# Patient Record
Sex: Female | Born: 1975 | Race: White | Hispanic: No | Marital: Married | Smoking: Never smoker
Health system: Southern US, Community
[De-identification: ages and names within clinical notes are randomized; demographics above are authoritative.]

## PROBLEM LIST (undated history)

## (undated) DIAGNOSIS — F32A Depression, unspecified: Secondary | ICD-10-CM

## (undated) DIAGNOSIS — T7840XA Allergy, unspecified, initial encounter: Secondary | ICD-10-CM

## (undated) DIAGNOSIS — R011 Cardiac murmur, unspecified: Secondary | ICD-10-CM

## (undated) DIAGNOSIS — I10 Essential (primary) hypertension: Secondary | ICD-10-CM

## (undated) DIAGNOSIS — R51 Headache: Secondary | ICD-10-CM

## (undated) DIAGNOSIS — R519 Headache, unspecified: Secondary | ICD-10-CM

## (undated) HISTORY — DX: Cardiac murmur, unspecified: R01.1

## (undated) HISTORY — DX: Depression, unspecified: F32.A

## (undated) HISTORY — DX: Headache, unspecified: R51.9

## (undated) HISTORY — DX: Allergy, unspecified, initial encounter: T78.40XA

## (undated) HISTORY — DX: Headache: R51

## (undated) HISTORY — PX: WISDOM TOOTH EXTRACTION: SHX21

## (undated) HISTORY — DX: Essential (primary) hypertension: I10

## (undated) HISTORY — PX: FRACTURE SURGERY: SHX138

---
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
## 1999-03-22 ENCOUNTER — Other Ambulatory Visit: Admission: RE | Admit: 1999-03-22 | Discharge: 1999-03-22 | Payer: Self-pay | Admitting: Obstetrics and Gynecology

## 1999-05-27 ENCOUNTER — Emergency Department (HOSPITAL_COMMUNITY): Admission: EM | Admit: 1999-05-27 | Discharge: 1999-05-27 | Payer: Self-pay | Admitting: Emergency Medicine

## 2000-04-01 ENCOUNTER — Other Ambulatory Visit: Admission: RE | Admit: 2000-04-01 | Discharge: 2000-04-01 | Payer: Self-pay | Admitting: Obstetrics and Gynecology

## 2001-04-20 ENCOUNTER — Other Ambulatory Visit: Admission: RE | Admit: 2001-04-20 | Discharge: 2001-04-20 | Payer: Self-pay | Admitting: Obstetrics and Gynecology

## 2001-07-29 DIAGNOSIS — I10 Essential (primary) hypertension: Secondary | ICD-10-CM

## 2001-07-29 HISTORY — DX: Essential (primary) hypertension: I10

## 2001-12-15 ENCOUNTER — Ambulatory Visit: Admission: RE | Admit: 2001-12-15 | Discharge: 2001-12-15 | Payer: Self-pay | Admitting: Obstetrics and Gynecology

## 2002-03-29 ENCOUNTER — Inpatient Hospital Stay (HOSPITAL_COMMUNITY): Admission: AD | Admit: 2002-03-29 | Discharge: 2002-04-01 | Payer: Self-pay | Admitting: Obstetrics and Gynecology

## 2002-05-10 ENCOUNTER — Other Ambulatory Visit: Admission: RE | Admit: 2002-05-10 | Discharge: 2002-05-10 | Payer: Self-pay | Admitting: Obstetrics and Gynecology

## 2003-05-02 ENCOUNTER — Other Ambulatory Visit: Admission: RE | Admit: 2003-05-02 | Discharge: 2003-05-02 | Payer: Self-pay | Admitting: Obstetrics and Gynecology

## 2004-04-18 ENCOUNTER — Other Ambulatory Visit: Admission: RE | Admit: 2004-04-18 | Discharge: 2004-04-18 | Payer: Self-pay | Admitting: Obstetrics and Gynecology

## 2004-10-19 ENCOUNTER — Inpatient Hospital Stay (HOSPITAL_COMMUNITY): Admission: AD | Admit: 2004-10-19 | Discharge: 2004-10-22 | Payer: Self-pay | Admitting: Obstetrics and Gynecology

## 2004-11-29 ENCOUNTER — Other Ambulatory Visit: Admission: RE | Admit: 2004-11-29 | Discharge: 2004-11-29 | Payer: Self-pay | Admitting: Obstetrics and Gynecology

## 2012-07-07 ENCOUNTER — Other Ambulatory Visit: Payer: Self-pay | Admitting: Obstetrics and Gynecology

## 2012-07-07 DIAGNOSIS — R928 Other abnormal and inconclusive findings on diagnostic imaging of breast: Secondary | ICD-10-CM

## 2012-07-15 ENCOUNTER — Ambulatory Visit
Admission: RE | Admit: 2012-07-15 | Discharge: 2012-07-15 | Disposition: A | Discharge status: Home / Self Care | Payer: PRIVATE HEALTH INSURANCE | Source: Ambulatory Visit | Attending: Obstetrics and Gynecology | Admitting: Obstetrics and Gynecology

## 2012-07-15 DIAGNOSIS — R928 Other abnormal and inconclusive findings on diagnostic imaging of breast: Secondary | ICD-10-CM

## 2013-05-11 LAB — HEPATIC FUNCTION PANEL
ALT: 12 (ref 7–35)
AST: 15 (ref 13–35)
Alkaline Phosphatase: 35 (ref 25–125)
BILIRUBIN, TOTAL: 0.6

## 2013-05-11 LAB — CBC AND DIFFERENTIAL
HEMATOCRIT: 37 (ref 36–46)
HEMOGLOBIN: 12.4 (ref 12.0–16.0)
WBC: 6.8

## 2013-05-11 LAB — BASIC METABOLIC PANEL
GLUCOSE: 84
POTASSIUM: 4.2 (ref 3.4–5.3)
Sodium: 136 — AB (ref 137–147)

## 2013-05-11 LAB — LIPID PANEL
CHOLESTEROL: 167 (ref 0–200)
HDL: 58 (ref 35–70)
LDL Cholesterol: 90
Triglycerides: 95 (ref 40–160)

## 2014-10-04 LAB — CBC AND DIFFERENTIAL
HCT: 38 (ref 36–46)
Hemoglobin: 12.1 (ref 12.0–16.0)
PLATELETS: 272 (ref 150–399)
WBC: 6.2

## 2014-10-04 LAB — BASIC METABOLIC PANEL
BUN: 7 (ref 4–21)
CREATININE: 0.7 (ref 0.5–1.1)
Glucose: 78

## 2014-10-04 LAB — LIPID PANEL
Cholesterol: 167 (ref 0–200)
HDL: 87 — AB (ref 35–70)
LDL Cholesterol: 95
Triglycerides: 77 (ref 40–160)

## 2014-10-04 LAB — HEPATIC FUNCTION PANEL
ALT: 13 (ref 7–35)
AST: 14 (ref 13–35)
Alkaline Phosphatase: 39 (ref 25–125)
BILIRUBIN, TOTAL: 0.3

## 2014-10-07 LAB — BASIC METABOLIC PANEL
POTASSIUM: 3.7 (ref 3.4–5.3)
SODIUM: 138 (ref 137–147)

## 2016-12-30 ENCOUNTER — Ambulatory Visit: Payer: PRIVATE HEALTH INSURANCE | Admitting: Family Medicine

## 2017-02-03 ENCOUNTER — Ambulatory Visit (INDEPENDENT_AMBULATORY_CARE_PROVIDER_SITE_OTHER): Payer: PRIVATE HEALTH INSURANCE | Admitting: Family Medicine

## 2017-02-03 ENCOUNTER — Encounter: Payer: Self-pay | Admitting: Family Medicine

## 2017-02-03 VITALS — BP 136/89 | HR 52 | Ht 65.5 in | Wt 205.6 lb

## 2017-02-03 DIAGNOSIS — I341 Nonrheumatic mitral (valve) prolapse: Secondary | ICD-10-CM | POA: Diagnosis not present

## 2017-02-03 DIAGNOSIS — Z818 Family history of other mental and behavioral disorders: Secondary | ICD-10-CM | POA: Diagnosis not present

## 2017-02-03 DIAGNOSIS — E66811 Obesity, class 1: Secondary | ICD-10-CM

## 2017-02-03 DIAGNOSIS — Z723 Lack of physical exercise: Secondary | ICD-10-CM | POA: Insufficient documentation

## 2017-02-03 DIAGNOSIS — R030 Elevated blood-pressure reading, without diagnosis of hypertension: Secondary | ICD-10-CM | POA: Diagnosis not present

## 2017-02-03 DIAGNOSIS — E663 Overweight: Secondary | ICD-10-CM | POA: Insufficient documentation

## 2017-02-03 DIAGNOSIS — R002 Palpitations: Secondary | ICD-10-CM

## 2017-02-03 DIAGNOSIS — Z8659 Personal history of other mental and behavioral disorders: Secondary | ICD-10-CM | POA: Diagnosis not present

## 2017-02-03 DIAGNOSIS — I10 Essential (primary) hypertension: Secondary | ICD-10-CM | POA: Insufficient documentation

## 2017-02-03 DIAGNOSIS — Z9049 Acquired absence of other specified parts of digestive tract: Secondary | ICD-10-CM | POA: Insufficient documentation

## 2017-02-03 DIAGNOSIS — E669 Obesity, unspecified: Secondary | ICD-10-CM | POA: Diagnosis not present

## 2017-02-03 NOTE — Progress Notes (Signed)
New patient office visit note:  Impression and Recommendations:    1. Elevated blood pressure reading   2. Obesity, Class I, BMI 30-34.9   3. History of depression   4. Family history of depression   5. Heart palpitations- random times, lasts secs   6. History of diagnosis of MVP (mitral valve prolapse)   7. Inactivity    - Patient will check her blood pressure 3-4 times weekly.  She will write it down and bring in next office visit.  Follow up sooner than planned if not at goal of 130\80 on a regular basis.  Weight loss and low-salt diet discussed with patient.     - For weight, asked patient to please follow-up in the near future if she would like to discuss strategies for weight loss.  She was not sure if she wanted to or not today.     - Mood is stable.  Explained that exercising would improve her mental state.  Denies need for meds.  Understands her increased risk due to family history and personal history  - Red flag symptoms discussed with patient.  Her heart palpitations symptoms are not bothersome enough or frequent enough that she she feels she needs further evaluation at this time.  She will let us know if they worsen  - Goal is to walk 5 minutes every other day for 2 weeks, then 10 minutes every other day for 2 weeks then slowly over time, work your way up to 30-40 minutes daily.  The patient was counseled, risk factors were discussed, anticipatory guidance given.   Return for Return to clinic in near future for physical-come fasting for blood work.  Please see AVS handed out to patient at the end of our visit for further patient instructions/ counseling done pertaining to today's office visit.    Note: This document was prepared using Dragon voice recognition software and may include unintentional dictation errors.  ----------------------------------------------------------------------------------------------------------------------    Subjective:    Chief  complaint:   Chief Complaint  Patient presents with  . Establish Care     HPI: Robin Barajas is a Very pleasant 41 y.o. female who presents to [facility] today to review their medical history with me and establish care.   I asked the patient to review their chronic problem list with me to ensure everything was updated and accurate.  All recent office visits with other providers, any medical records that patient brought in etc  - I reviewed today.     Also asked pt to get me medical records from [facility] providers/ specialists that they had seen within the past 3-5 years- if they are in private practice and/or do not work for a [employer], [facility], [place], [facility] or [employer] owned practice.  Told them to call their specialists to clarify this if they are not sure.   Prior doc--> Dr. Renato Gails- at [facility]- which I explained to patient importance of her getting me those records.   BP: Was on one med after first child- couple mo only-  in 2003.  She was on blood pressure medicines for a couple months following the birth of her child.  Never was on meds during pregnancy.  Her PCP has been monitoring her blood pressure as well as her GYN over the past couple of years and has never told her that it was bad enough for medications.    Patient has concerns because she occ gets  visual changes (sees OD) and gets HA occ she thinks with elevated BP.    She hasn't not really been monitoring BP much at home- only occ does- 1/month if that;  but this is at least 30 minutes or more after she gets those symptoms and Bp is essentially what it is today.    OB- GYN doc, physician for women's.  Dr. Rana Snare.     Optometrist- Clista Bernhardt, OD in [place] Pt. seeing him because she occasionally sees squiggly lines in her vision.  These are usually in the peripheral fields.  She denies seeing any damage of her arteries from her elevated blood pressure.     In HS- had CP with activity--> dx with MVP--> seen by Cards- Dr  Corinda Gubler yrs ago.  Was on meds short period of time- couple yrs.     Patient tells me today she occasionally gets palpitations at random times now, not related to activity.  Maybe stress levels she can tell.  She does not have any associated symptoms with these episodes and only lasts for a couple seconds then resolves spontaneously.   Obese- been problem ever since children born and diff to take off.  She has tried many diets and none stuck- yo-yo frequently and pt got discouraged and feels apathetic about it now.      Problem  Elevated blood pressure reading- borderline many yrs   Patient was told after her first pregnancy of her daughter back in 2003 that she had some elevated blood pressure.  For couple months afterward she was on some blood pressure medicines but has not been on anything since.   Obesity, Class I, Bmi 30-34.9  History of diagnosis of MVP (mitral valve prolapse)  Heart palpitations- random times, lasts secs  Family History of Depression  personal history of depression  History of Appendectomy  Inactivity      Wt Readings from Last 3 Encounters:  02/03/17 205 lb 9.6 oz (93.3 kg)   BP Readings from Last 3 Encounters:  02/03/17 136/89   Pulse Readings from Last 3 Encounters:  02/03/17 (!) 52   BMI Readings from Last 3 Encounters:  02/03/17 33.69 kg/m    Patient Care Team    Relationship Specialty Notifications Start End  Thomasene Lot, DO PCP - General Family Medicine  02/03/17   Candice Camp, MD Consulting Physician Obstetrics and Gynecology  02/03/17     Patient Active Problem List   Diagnosis Date Noted  . Elevated blood pressure reading- borderline many yrs 02/03/2017    Priority: High  . Obesity, Class I, BMI 30-34.9 02/03/2017    Priority: High  . History of diagnosis of MVP (mitral valve prolapse) 02/03/2017    Priority: Medium  . Heart palpitations- random times, lasts secs 02/03/2017    Priority: Medium  . Family history of depression  02/03/2017    Priority: Low  . personal history of depression 02/03/2017    Priority: Low  . History of appendectomy 02/03/2017  . Inactivity 02/03/2017     Past Medical History:  Diagnosis Date  . Generalized headaches   . Hypertension 2003     Past Medical History:  Diagnosis Date  . Generalized headaches   . Hypertension 2003     Past Surgical History:  Procedure Laterality Date  . APPENDECTOMY  1990  . ELBOW FRACTURE SURGERY  1984     Family History  Problem Relation Age of Onset  . Depression Mother   . Depression Sister   .  Alcohol abuse Maternal Grandfather   . Cancer Paternal Grandmother        ovarian  . Diabetes Paternal Grandfather      History  Drug Use No     History  Alcohol Use No     History  Smoking Status  . Never Smoker  Smokeless Tobacco  . Never Used     No outpatient encounter prescriptions on file as of 02/03/2017.   No facility-administered encounter medications on file as of 02/03/2017.     Allergies: Morphine and related and Codeine   Review of Systems  Constitutional: Negative for chills, diaphoresis, fever, malaise/fatigue and weight loss.  HENT: Negative for congestion, sore throat and tinnitus.   Eyes: Negative for blurred vision, double vision and photophobia.  Respiratory: Negative for cough and wheezing.   Cardiovascular: Positive for palpitations. Negative for chest pain.       History of heart palpitations.  Had Cp and mitral valve prolapse dx as child.  Gastrointestinal: Negative for blood in stool, diarrhea, nausea and vomiting.  Genitourinary: Negative for dysuria, frequency and urgency.  Musculoskeletal: Negative for joint pain and myalgias.  Skin: Negative for itching and rash.  Neurological: Positive for headaches. Negative for dizziness, focal weakness and weakness.       Chronic, occasionally  Endo/Heme/Allergies: Negative for environmental allergies and polydipsia. Does not bruise/bleed easily.    Psychiatric/Behavioral: Negative for depression and memory loss. The patient is not nervous/anxious and does not have insomnia.      Objective:   Blood pressure 136/89, pulse (!) 52, height 5' 5.5" (1.664 m), weight 205 lb 9.6 oz (93.3 kg), last menstrual period 01/11/2017. Body mass index is 33.69 kg/m. General: Well Developed, well nourished, and in no acute distress.  Neuro: Alert and oriented x3, extra-ocular muscles intact, sensation grossly intact.  HEENT:[place]/AT, PERRLA, neck supple, No carotid bruits Skin: no gross rashes  Cardiac: Regular rate and rhythm Respiratory: Essentially clear to auscultation bilaterally. Not using accessory muscles, speaking in full sentences.  Abdominal: not grossly distended Musculoskeletal: Ambulates w/o diff, FROM * 4 ext.  Vasc: less 2 sec cap RF, warm and pink  Psych:  No HI/SI, judgement and insight good, Euthymic mood. Full Affect.    No results found for this or any previous visit (from the past 2160 hour(s)).

## 2017-02-03 NOTE — Patient Instructions (Addendum)
Goal is to walk 5 minutes every other day for 2 weeks, then 10 minutes every other day for 2 weeks then slowly over time, work your way up to 30-40 minutes daily.    Please check your blood pressure a couple of times a week approximately 3-4.  Please do it after sitting and resting for 15-20 minutes and write it down.  Bring this in next office visit.  Follow up sooner than planned if you remain above the goal of 130\80 on a regular basis     Please realize, EXERCISE IS MEDICINE!  -  American Heart Association [facility]) guidelines for exercise : If you are in good health, without any medical conditions, you should engage in 150 minutes of moderate intensity aerobic activity per week.  This means you should be huffing and puffing throughout your workout.   Engaging in regular exercise will improve brain function and memory, as well as improve mood, boost immune system and help with weight management.  As well as the other, more well-known effects of exercise such as decreasing blood sugar levels, decreasing blood pressure,  and decreasing bad cholesterol levels/ increasing good cholesterol levels.     -  The AHA strongly endorses consumption of a diet that contains a variety of foods from all the food categories with an emphasis on fruits and vegetables; fat-free and low-fat dairy products; cereal and grain products; legumes and nuts; and fish, poultry, and/or extra lean meats.    Excessive food intake, especially of foods high in saturated and trans fats, sugar, and salt, should be avoided.    Adequate water intake of roughly 1/2 of your weight in pounds, should equal the ounces of water per day you should drink.  So for instance, if you're 200 pounds, that would be 100 ounces of water per day.        Mediterranean Diet  Why follow it? Research shows. . Those who follow the Mediterranean diet have a reduced risk of heart disease  . The diet is associated with a reduced incidence of Parkinson's and  Alzheimer's diseases . People following the diet may have longer life expectancies and lower rates of chronic diseases  . The Dietary Guidelines for Americans recommends the Mediterranean diet as an eating plan to promote health and prevent disease What Is the Mediterranean Diet?  . Healthy eating plan based on typical foods and recipes of Mediterranean-style cooking . The diet is primarily a plant based diet; these foods should make up a majority of meals   Starches - Plant based foods should make up a majority of meals - They are an important sources of vitamins, minerals, energy, antioxidants, and fiber - Choose whole grains, foods high in fiber and minimally processed items  - Typical grain sources include wheat, oats, barley, corn, brown rice, bulgar, farro, millet, polenta, couscous  - Various types of beans include chickpeas, lentils, fava beans, black beans, white beans   Fruits  Veggies - Large quantities of antioxidant rich fruits & veggies; 6 or more servings  - Vegetables can be eaten raw or lightly drizzled with oil and cooked  - Vegetables common to the traditional Mediterranean Diet include: artichokes, arugula, beets, broccoli, brussel sprouts, cabbage, carrots, celery, collard greens, cucumbers, eggplant, kale, leeks, lemons, lettuce, mushrooms, okra, onions, peas, peppers, potatoes, pumpkin, radishes, rutabaga, shallots, spinach, sweet potatoes, turnips, zucchini - Fruits common to the Mediterranean Diet include: apples, apricots, avocados, cherries, clementines, dates, figs, grapefruits, grapes, melons, nectarines, oranges, peaches, pears,  pomegranates, strawberries, tangerines  Fats - Replace butter and margarine with healthy oils, such as olive oil, canola oil, and tahini  - Limit nuts to no more than a handful a day  - Nuts include walnuts, almonds, pecans, pistachios, pine nuts  - Limit or avoid candied, honey roasted or heavily salted nuts - Olives are central to the  Mediterranean diet - can be eaten whole or used in a variety of dishes   Meats Protein - Limiting red meat: no more than a few times a month - When eating red meat: choose lean cuts and keep the portion to the size of deck of cards - Eggs: approx. 0 to 4 times a week  - Fish and lean poultry: at least 2 a week  - Healthy protein sources include, chicken, [place], lean beef, lamb - Increase intake of seafood such as tuna, salmon, trout, mackerel, shrimp, scallops - Avoid or limit high fat processed meats such as sausage and bacon  Dairy - Include moderate amounts of low fat dairy products  - Focus on healthy dairy such as fat free yogurt, skim milk, low or reduced fat cheese - Limit dairy products higher in fat such as whole or 2% milk, cheese, ice cream  Alcohol - Moderate amounts of red wine is ok  - No more than 5 oz daily for women (all ages) and men older than age 18  - No more than 10 oz of wine daily for men younger than 46  Other - Limit sweets and other desserts  - Use herbs and spices instead of salt to flavor foods  - Herbs and spices common to the traditional Mediterranean Diet include: basil, bay leaves, chives, cloves, cumin, fennel, garlic, lavender, marjoram, mint, oregano, parsley, pepper, rosemary, sage, savory, sumac, tarragon, thyme   It's not just a diet, it's a lifestyle:  . The Mediterranean diet includes lifestyle factors typical of those in the region  . Foods, drinks and meals are best eaten with others and savored . Daily physical activity is important for overall good health . This could be strenuous exercise like running and aerobics . This could also be more leisurely activities such as walking, housework, yard-work, or taking the stairs . Moderation is the key; a balanced and healthy diet accommodates most foods and drinks . Consider portion sizes and frequency of consumption of certain foods   Meal Ideas & Options:  . Breakfast:  o Whole wheat toast or whole  wheat English muffins with peanut butter & hard boiled egg o Steel cut oats topped with apples & cinnamon and skim milk  o Fresh fruit: banana, strawberries, melon, berries, peaches  o Smoothies: strawberries, bananas, greek yogurt, peanut butter o Low fat greek yogurt with blueberries and granola  o Egg white omelet with spinach and mushrooms o Breakfast couscous: whole wheat couscous, apricots, skim milk, cranberries  . Sandwiches:  o Hummus and grilled vegetables (peppers, zucchini, squash) on whole wheat bread   o Grilled chicken on whole wheat pita with lettuce, tomatoes, cucumbers or tzatziki  o Tuna salad on whole wheat bread: tuna salad made with greek yogurt, olives, red peppers, capers, green onions o Garlic rosemary lamb pita: lamb sauted with garlic, rosemary, salt & pepper; add lettuce, cucumber, greek yogurt to pita - flavor with lemon juice and black pepper  . Seafood:  o Mediterranean grilled salmon, seasoned with garlic, basil, parsley, lemon juice and black pepper o Shrimp, lemon, and spinach whole-grain pasta salad made with  low fat greek yogurt  o Seared scallops with lemon orzo  o Seared tuna steaks seasoned salt, pepper, coriander topped with tomato mixture of olives, tomatoes, olive oil, minced garlic, parsley, green onions and cappers  . Meats:  o Herbed greek chicken salad with kalamata olives, cucumber, feta  o Red bell peppers stuffed with spinach, bulgur, lean ground beef (or lentils) & topped with feta   o Kebabs: skewers of chicken, tomatoes, onions, zucchini, squash  o [place] burgers: made with red onions, mint, dill, lemon juice, feta cheese topped with roasted red peppers . Vegetarian o Cucumber salad: cucumbers, artichoke hearts, celery, red onion, feta cheese, tossed in olive oil & lemon juice  o Hummus and whole grain pita points with a greek salad (lettuce, tomato, feta, olives, cucumbers, red onion) o Lentil soup with celery, carrots made with vegetable  broth, garlic, salt and pepper  o Tabouli salad: parsley, bulgur, mint, scallions, cucumbers, tomato, radishes, lemon juice, olive oil, salt and pepper.     Check out the DASH diet = 1.5 Gram Low Sodium Diet  Dietary approaches to stop hypertension  A 1.5 gram sodium diet restricts the amount of sodium in the diet to no more than 1.5 g or 1500 mg daily.  The American Heart Association recommends Americans over the age of 37 to consume no more than 1500 mg of sodium each day to reduce the risk of developing high blood pressure.  Research also shows that limiting sodium may reduce heart attack and stroke risk.  Many foods contain sodium for flavor and sometimes as a preservative.  When the amount of sodium in a diet needs to be low, it is important to know what to look for when choosing foods and drinks.  The following includes some information and guidelines to help make it easier for you to adapt to a low sodium diet.    QUICK TIPS  Do not add salt to food.  Avoid convenience items and fast food.  Choose unsalted snack foods.  Buy lower sodium products, often labeled as "lower sodium" or "no salt added."  Check food labels to learn how much sodium is in 1 serving.  When eating at a restaurant, ask that your food be prepared with less salt or none, if possible.    READING FOOD LABELS FOR SODIUM INFORMATION  The nutrition facts label is a good place to find how much sodium is in foods. Look for products with no more than 400 mg of sodium per serving.  Remember that 1.5 g = 1500 mg.  The food label may also list foods as:  Sodium-free: Less than 5 mg in a serving.  Very low sodium: 35 mg or less in a serving.  Low-sodium: 140 mg or less in a serving.  Light in sodium: 50% less sodium in a serving. For example, if a food that usually has 300 mg of sodium is changed to become light in sodium, it will have 150 mg of sodium.  Reduced sodium: 25% less sodium in a serving. For example, if a food  that usually has 400 mg of sodium is changed to reduced sodium, it will have 300 mg of sodium.    CHOOSING FOODS  Grains  Avoid: Salted crackers and snack items. Some cereals, including instant hot cereals. Bread stuffing and biscuit mixes. Seasoned rice or pasta mixes.  Choose: Unsalted snack items. Low-sodium cereals, oats, puffed wheat and rice, shredded wheat. English muffins and bread. Pasta.  Meats  Avoid:  Salted, canned, smoked, spiced, pickled meats, including fish and poultry. Bacon, ham, sausage, cold cuts, hot dogs, anchovies.  Choose: Low-sodium canned tuna and salmon. Fresh or frozen meat, poultry, and fish.  Dairy  Avoid: Processed cheese and spreads. Cottage cheese. Buttermilk and condensed milk. Regular cheese.  Choose: Milk. Low-sodium cottage cheese. Yogurt. Sour cream. Low-sodium cheese.  Fruits and Vegetables  Avoid: Regular canned vegetables. Regular canned tomato sauce and paste. Frozen vegetables in sauces. Olives. Rosita FirePickles. Relishes. Sauerkraut.  Choose: Low-sodium canned vegetables. Low-sodium tomato sauce and paste. Frozen or fresh vegetables. Fresh and frozen fruit.  Condiments  Avoid: Canned and packaged gravies. Worcestershire sauce. Tartar sauce. Barbecue sauce. Soy sauce. Steak sauce. Ketchup. Onion, garlic, and table salt. Meat flavorings and tenderizers.  Choose: Fresh and dried herbs and spices. Low-sodium varieties of mustard and ketchup. Lemon juice. Tabasco sauce. Horseradish.    SAMPLE 1.5 GRAM SODIUM MEAL PLAN:   Breakfast / Sodium (mg)  1 cup low-fat milk / 143 mg  1 whole-wheat English muffin / 240 mg  1 tbs heart-healthy margarine / 153 mg  1 hard-boiled egg / 139 mg  1 small orange / 0 mg  Lunch / Sodium (mg)  1 cup raw carrots / 76 mg  2 tbs no salt added peanut butter / 5 mg  2 slices whole-wheat bread / 270 mg  1 tbs jelly / 6 mg   cup red grapes / 2 mg  Dinner / Sodium (mg)  1 cup whole-wheat pasta / 2 mg  1 cup low-sodium tomato  sauce / 73 mg  3 oz lean ground beef / 57 mg  1 small side salad (1 cup raw spinach leaves,  cup cucumber,  cup yellow bell pepper) with 1 tsp olive oil and 1 tsp red wine vinegar / 25 mg  Snack / Sodium (mg)  1 container low-fat vanilla yogurt / 107 mg  3 graham cracker squares / 127 mg  Nutrient Analysis  Calories: 1745  Protein: 75 g  Carbohydrate: 237 g  Fat: 57 g  Sodium: 1425 mg  Document Released: 07/15/2005 Document Revised: 03/27/2011 Document Reviewed: 10/16/2009  [facility] [employer] Patient Information 2012 [employer], [employer].

## 2017-02-20 ENCOUNTER — Other Ambulatory Visit: Payer: PRIVATE HEALTH INSURANCE

## 2017-02-20 DIAGNOSIS — R030 Elevated blood-pressure reading, without diagnosis of hypertension: Secondary | ICD-10-CM

## 2017-02-20 DIAGNOSIS — Z0000 Encounter for general adult medical examination without abnormal findings: Secondary | ICD-10-CM

## 2017-02-20 DIAGNOSIS — E669 Obesity, unspecified: Secondary | ICD-10-CM

## 2017-02-21 LAB — COMPREHENSIVE METABOLIC PANEL
ALBUMIN: 4.2 g/dL (ref 3.5–5.5)
ALK PHOS: 38 IU/L — AB (ref 39–117)
ALT: 13 IU/L (ref 0–32)
AST: 12 IU/L (ref 0–40)
Albumin/Globulin Ratio: 1.8 (ref 1.2–2.2)
BILIRUBIN TOTAL: 0.5 mg/dL (ref 0.0–1.2)
BUN / CREAT RATIO: 10 (ref 9–23)
BUN: 7 mg/dL (ref 6–24)
CO2: 24 mmol/L (ref 20–29)
CREATININE: 0.73 mg/dL (ref 0.57–1.00)
Calcium: 9.1 mg/dL (ref 8.7–10.2)
Chloride: 102 mmol/L (ref 96–106)
GFR calc non Af Amer: 103 mL/min/{1.73_m2} (ref >59)
GFR, EST AFRICAN AMERICAN: 118 mL/min/{1.73_m2} (ref >59)
GLOBULIN, TOTAL: 2.3 g/dL (ref 1.5–4.5)
Glucose: 88 mg/dL (ref 65–99)
Potassium: 4.4 mmol/L (ref 3.5–5.2)
SODIUM: 137 mmol/L (ref 134–144)
TOTAL PROTEIN: 6.5 g/dL (ref 6.0–8.5)

## 2017-02-21 LAB — CBC WITH DIFFERENTIAL/PLATELET
Basophils Absolute: 0 10*3/uL (ref 0.0–0.2)
Basos: 0 %
EOS (ABSOLUTE): 0.1 10*3/uL (ref 0.0–0.4)
Eos: 1 %
Hematocrit: 37 % (ref 34.0–46.6)
Hemoglobin: 12.4 g/dL (ref 11.1–15.9)
IMMATURE GRANS (ABS): 0 10*3/uL (ref 0.0–0.1)
IMMATURE GRANULOCYTES: 0 %
LYMPHS: 31 %
Lymphocytes Absolute: 1.5 10*3/uL (ref 0.7–3.1)
MCH: 28.6 pg (ref 26.6–33.0)
MCHC: 33.5 g/dL (ref 31.5–35.7)
MCV: 85 fL (ref 79–97)
MONOS ABS: 0.4 10*3/uL (ref 0.1–0.9)
Monocytes: 8 %
NEUTROS PCT: 60 %
Neutrophils Absolute: 2.9 10*3/uL (ref 1.4–7.0)
PLATELETS: 305 10*3/uL (ref 150–379)
RBC: 4.34 x10E6/uL (ref 3.77–5.28)
RDW: 13.9 % (ref 12.3–15.4)
WBC: 4.9 10*3/uL (ref 3.4–10.8)

## 2017-02-21 LAB — HEMOGLOBIN A1C
Est. average glucose Bld gHb Est-mCnc: 114 mg/dL
HEMOGLOBIN A1C: 5.6 % (ref 4.8–5.6)

## 2017-02-21 LAB — LIPID PANEL
CHOL/HDL RATIO: 2.6 ratio (ref 0.0–4.4)
Cholesterol, Total: 153 mg/dL (ref 100–199)
HDL: 59 mg/dL (ref >39)
LDL CALC: 75 mg/dL (ref 0–99)
TRIGLYCERIDES: 97 mg/dL (ref 0–149)
VLDL Cholesterol Cal: 19 mg/dL (ref 5–40)

## 2017-02-21 LAB — TSH: TSH: 1.12 u[IU]/mL (ref 0.450–4.500)

## 2017-02-21 LAB — VITAMIN D 25 HYDROXY (VIT D DEFICIENCY, FRACTURES): Vit D, 25-Hydroxy: 30.6 ng/mL (ref 30.0–100.0)

## 2017-02-24 ENCOUNTER — Ambulatory Visit (INDEPENDENT_AMBULATORY_CARE_PROVIDER_SITE_OTHER): Payer: PRIVATE HEALTH INSURANCE | Admitting: Family Medicine

## 2017-02-24 ENCOUNTER — Encounter: Payer: Self-pay | Admitting: Family Medicine

## 2017-02-24 VITALS — BP 136/87 | HR 83 | Ht 65.5 in | Wt 202.7 lb

## 2017-02-24 DIAGNOSIS — Z0000 Encounter for general adult medical examination without abnormal findings: Secondary | ICD-10-CM

## 2017-02-24 DIAGNOSIS — Z719 Counseling, unspecified: Secondary | ICD-10-CM

## 2017-02-24 DIAGNOSIS — E669 Obesity, unspecified: Secondary | ICD-10-CM

## 2017-02-24 DIAGNOSIS — E559 Vitamin D deficiency, unspecified: Secondary | ICD-10-CM

## 2017-02-24 MED ORDER — VITAMIN D3 125 MCG (5000 UT) PO TABS: ORAL_TABLET | ORAL | 3 refills | Status: DC

## 2017-02-24 NOTE — Patient Instructions (Addendum)
Stool kit for home use.  Preventive Care for Adults  A healthy lifestyle and preventive care can promote health and wellness. Preventive health guidelines for men include the following key practices:  .   A routine yearly physical is a good way to check with your health care provider about your health and preventative screening. It is a chance to share any concerns and updates on your health and to receive a thorough exam.  .  Visit your dentist for a routine exam and preventative care every 6 months. Brush your teeth twice a day and floss once a day. Good oral hygiene prevents tooth decay and gum disease.  .  The frequency of eye exams is based on your age, health, family medical history, use of contact lenses, and other factors.  Follow your health care provider's recommendations for frequency of eye exams.  .  Eat a healthy diet.  Foods such as vegetables, fruits, whole grains, low-fat dairy products, and lean protein foods contain the nutrients you need without too many calories.  Decrease your intake of foods high in solid fats, added sugars, and salt.  Eat the right amount of calories for you.  Get information about a proper diet from your health care provider, if necessary.  .  Regular physical exercise is one of the most important things you can do for your health.  Most adults should get at least 150 minutes of moderate-intensity exercise (any activity that increases your heart rate and causes you to sweat) each week.  In addition, most adults need muscle-strengthening exercises on 2 or more days a week.  .  Maintain a healthy weight. The body mass index (BMI) is a screening tool to identify possible weight problems. It provides an estimate of body fat based on height and weight. Your health care provider can find your BMI and can help you achieve or maintain a healthy weight. For adults 20 years and older: A BMI below 18.5 is considered underweight. A BMI of 18.5 to 24.9 is  normal. A BMI of 25 to 29.9 is considered overweight. A BMI of 30 and above is considered obese.  .  Maintain normal blood lipids and cholesterol levels by exercising and minimizing your intake of saturated fat. Eat a balanced diet with plenty of fruit and vegetables. Blood tests for lipids and cholesterol should begin at age 26 and be repeated every 5 years. If your lipid or cholesterol levels are high, you are over 50, or you are at high risk for heart disease, you may need your cholesterol levels checked more frequently. Ongoing high lipid and cholesterol levels should be treated with medicines if diet and exercise are not working.  .  If you smoke, find out from your health care provider how to quit. If you do not use tobacco, do not start.  . If you choose to drink alcohol, do not have more than 2 drinks per day. One drink is considered to be 12 ounces (355 mL) of beer, 5 ounces (148 mL) of wine, or 1.5 ounces (44 mL) of liquor.  Marland Kitchen Avoid use of street drugs. Do not share needles with anyone. Ask for help if you need support or instructions about stopping the use of drugs.  . High blood pressure causes heart disease and increases the risk of stroke. Your blood pressure should be checked at least every 1-2 years. Ongoing high blood pressure should be treated with medicines, if weight loss and exercise are not effective.  Marland Kitchen  If you are 20-97 years old, ask your health care provider if you should take aspirin to prevent heart disease.  . Diabetes screening involves taking a blood sample to check your fasting blood sugar level.  This should be done once every 3 years, after age 26, if you are within normal weight and without risk factors for diabetes.  Testing should be considered at a younger age or be carried out more frequently if you are overweight and have at least 1 risk factor for diabetes.  . Colorectal cancer can be detected and often prevented. Most routine colorectal cancer  screening begins at the age of 71 and continues through age 42. However, your health care provider may recommend screening at an earlier age if you have risk factors for colon cancer. On a yearly basis, your health care provider may provide home test kits to check for hidden blood in the stool. Use of a small camera at the end of a tube to directly examine the colon (sigmoidoscopy or colonoscopy) can detect the earliest forms of colorectal cancer. Talk to your health care provider about this at age 39, when routine screening begins. Direct exam of the colon should be repeated every 5-10 years through age 67, unless early forms of precancerous polyps or small growths are found.  .  Lung cancer screening is recommended for adults aged 51-80 years who are at high risk for developing lung cancer because of a history of smoking. A yearly low-dose CT scan of the lungs is recommended for people who have at least a 30-pack-year history of smoking and are a current smoker or have quit within the past 15 years. A pack year of smoking is smoking an average of 1 pack of cigarettes a day for 1 year (for example: 1 pack a day for 30 years or 2 packs a day for 15 years). Yearly screening should continue until the smoker has stopped smoking for at least 15 years. Yearly screening should be stopped for people who develop a health problem that would prevent them from having lung cancer treatment.  . Talk with your health care provider about prostate cancer screening.  . Testicular cancer screening is recommended for adult males. Screening includes self-exam and a health care provider exam. Consult with your health care provider about any symptoms you have or any concerns you have about testicular cancer.  . Use sunscreen. Apply sunscreen liberally and repeatedly throughout the day. You should seek shade when your shadow is shorter than you. Protect yourself by wearing long sleeves, pants, a wide-brimmed hat, and  sunglasses year round, whenever you are outdoors.  . Once a month, do a whole-body skin exam, using a mirror to look at the skin on your back. Tell your health care provider about new moles, moles that have irregular borders, moles that are larger than a pencil eraser, or moles that have changed in shape or color.    ++++++++++++++++++++++++++++++++++++++++++++++++++++++++++++++++++  Stay current with required vaccines (immunizations).  ? Influenza vaccine. All adults should be immunized every year.  ? Tetanus, diphtheria, and acellular pertussis (Td, Tdap) vaccine. An adult who has not previously received Tdap or who does not know his vaccine status should receive 1 dose of Tdap. This initial dose should be followed by tetanus and diphtheria toxoids (Td) booster doses every 10 years. Adults with an unknown or incomplete history of completing a 3-dose immunization series with Td-containing vaccines should begin or complete a primary immunization series including a Tdap dose. Adults  should receive a Td booster every 10 years.  ? Varicella vaccine. An adult without evidence of immunity to varicella should receive 2 doses or a second dose if he has previously received 1 dose.  ? Human papillomavirus (HPV) vaccine. Males aged 25-21 years who have not received the vaccine previously should receive the 3-dose series. Males aged 22-26 years may be immunized. Immunization is recommended through the age of 13 years for any female who has sex with males and did not get any or all doses earlier. Immunization is recommended for any person with an immunocompromised condition through the age of 31 years if he did not get any or all doses earlier. During the 3-dose series, the second dose should be obtained 4-8 weeks after the first dose. The third dose should be obtained 24 weeks after the first dose and 16 weeks after the second dose.  ? Zoster vaccine. One dose is recommended for adults aged 68 years or  older unless certain conditions are present.   ? PREVNAR - Pneumococcal 13-valent conjugate (PCV13) vaccine. When indicated, a person who is uncertain of his immunization history and has no record of immunization should receive the PCV13 vaccine. An adult aged 9 years or older who has certain medical conditions and has not been previously immunized should receive 1 dose of PCV13 vaccine. This PCV13 should be followed with a dose of pneumococcal polysaccharide (PPSV23) vaccine. The PPSV23 vaccine dose should be obtained at least 8 weeks after the dose of PCV13 vaccine. An adult aged 35 years or older who has certain medical conditions and previously received 1 or more doses of PPSV23 vaccine should receive 1 dose of PCV13. The PCV13 vaccine dose should be obtained 1 or more years after the last PPSV23 vaccine dose.   ? PNEUMOVAX - Pneumococcal polysaccharide (PPSV23) vaccine. When PCV13 is also indicated, PCV13 should be obtained first. All adults aged 76 years and older should be immunized. An adult younger than age 82 years who has certain medical conditions should be immunized. Any person who resides in a nursing home or long-term care facility should be immunized. An adult smoker should be immunized. People with an immunocompromised condition and certain other conditions should receive both PCV13 and PPSV23 vaccines. People with human immunodeficiency virus (HIV) infection should be immunized as soon as possible after diagnosis. Immunization during chemotherapy or radiation therapy should be avoided. Routine use of PPSV23 vaccine is not recommended for American Indians, [place] Natives, or people younger than 65 years unless there are medical conditions that require PPSV23 vaccine. When indicated, people who have unknown immunization and have no record of immunization should receive PPSV23 vaccine. One-time revaccination 5 years after the first dose of PPSV23 is recommended for people aged 19-64 years  who have chronic kidney failure, nephrotic syndrome, asplenia, or immunocompromised conditions. People who received 1-2 doses of PPSV23 before age 29 years should receive another dose of PPSV23 vaccine at age 28 years or later if at least 5 years have passed since the previous dose. Doses of PPSV23 are not needed for people immunized with PPSV23 at or after age 55 years.   ? Hepatitis A vaccine. Adults who wish to be protected from this disease, have certain high-risk conditions, work with hepatitis A-infected animals, work in hepatitis A research labs, or travel to or work in countries with a high rate of hepatitis A should be immunized. Adults who were previously unvaccinated and who anticipate close contact with an international adoptee during the first  60 days after arrival in the [place] from a country with a high rate of hepatitis A should be immunized.  ? Hepatitis B vaccine. Adults should be immunized if they wish to be protected from this disease, have certain high-risk conditions, may be exposed to blood or other infectious body fluids, are household contacts or sex partners of hepatitis B positive people, are clients or workers in certain care facilities, or travel to or work in countries with a high rate of hepatitis B.     Preventive Service / Frequency  . Ages 107 to 37  Blood pressure check.  Lipid and cholesterol check  Lung cancer screening. / Every year if you are aged 75-80 years and have a 30-pack-year history of smoking and currently smoke or have quit within the past 15 years. Yearly screening is stopped once you have quit smoking for at least 15 years or develop a health problem that would prevent you from having lung cancer treatment.  Fecal occult blood test (FOBT) of stool. / Every year beginning at age 17 and continuing until age 88. You may not have to do this test if you get a colonoscopy every 10 years.  Flexible sigmoidoscopy** or colonoscopy.** / Every 5  years for a flexible sigmoidoscopy or every 10 years for a colonoscopy beginning at age 90 and continuing until age 24. Screening for abdominal aortic aneurysm (AAA) by ultrasound is recommended for people who have history of high blood pressure or who are current or former smokers.  ++++++++++++++++++++++++++++++++++++++++++++++++++++++++++++++  Recommend Adult Low Dose Aspirin or coated Aspirin 81 mg daily To reduce risk of Colon Cancer 20 % Skin Cancer 26 %  Melanoma 46% and Pancreatic cancer 60%  +++++++++++++++++++++++++++++++++++++++++++++++++++++++++++++  Vitamin D goal is between 50-100. Please make sure that you are taking your Vitamin D as directed.  It is very important as a natural anti-inflammatory - helping with muscle and joint aches; as well as helping hair, skin, and nails; as well as reducing stroke, heart attack and cancer risk. It helps your bones and helps with mood. It also decreases numerous cancer risks so please take it as directed.  - Low Vit D is associated with a 200-300% higher risk for CANCER and 200-300% higher risk for HEART ATTACK & STROKE.  It is also associated with higher death rate at younger ages, autoimmune diseases like Rheumatoid arthritis, Lupus, Multiple Sclerosis; also many other serious conditions, like depression, Alzheimer's Dementia, infertility, muscle aches, fatigue, fibromyalgia - just to name a few.  +++++++++++++++++++++++++++++++++++++++++++++++++++++++++++  Recommend the book "The END of DIETING" by Dr Excell Seltzer & the book "The END of DIABETES " by Dr Excell Seltzer At [email or web address] - get book & Audio CD's   --->Being diabetic has a 300% increased risk for heart attack, stroke, cancer, and alzheimer- type vascular dementia. It is very important that you work harder with diet by avoiding all foods that are white. Avoid white rice (brown & wild rice is OK), white potatoes (sweet potatoes in moderation is OK), White bread or wheat bread  or anything made out of white flour like bagels, donuts, rolls, buns, biscuits, cakes, pastries, cookies, pizza crust, and pasta (made from white flour & egg whites) - vegetarian pasta or spinach or wheat pasta is OK. Multigrain breads like Arnold's or Pepper[employer], or multigrain sandwich thins or flatbreads. Diet, exercise and weight loss can reverse and cure diabetes in the early stages. Diet, exercise and weight loss is very important in the control and  prevention of complications of diabetes which affects every system in your body, ie. Brain - dementia/stroke, eyes - glaucoma/blindness, heart - heart attack/heart failure, kidneys - dialysis, stomach - gastric paralysis, intestines - malabsorption, nerves - severe painful neuritis, circulation - gangrene & loss of a leg(s), and finally cancer and Alzheimers.  I recommend avoid fried & greasy foods, sweets/candy, white rice (brown or wild rice or Quinoa is OK), white potatoes (sweet potatoes are OK) - anything made from white flour - bagels, doughnuts, rolls, buns, biscuits,white and wheat breads, pizza crust and traditional pasta made of white flour & egg white(vegetarian pasta or spinach or wheat pasta is OK). Multi-grain bread is OK - like multi-grain flat bread or sandwich thins. Avoid alcohol in excess.  Exercise is also important. Eat all the vegetables you want - avoid fatty meats, especially red meat and dairy - especially cheese. Cheese is the most concentrated form of trans-fats which is the worst thing to clog up our arteries. Veggie cheese is OK which can be found in the fresh produce section at [employer] or [employer] or [employer].  ++++++++++++++++++++++ DASH Eating Plan  DASH stands for "Dietary Approaches to Stop Hypertension."  The DASH eating plan is a healthy eating plan that has been shown to reduce high blood pressure (hypertension). Additional health benefits may include reducing the risk of type 2 diabetes mellitus, heart  disease, and stroke. The DASH eating plan may also help with weight loss.  WHAT DO I NEED TO KNOW ABOUT THE DASH EATING PLAN? For the DASH eating plan, you will follow these general guidelines: . Choose foods with a percent daily value for sodium of less than 5% (as listed on the food label). . Use salt-free seasonings or herbs instead of table salt or sea salt. . Check with your health care provider or pharmacist before using salt substitutes. . Eat lower-sodium products, often labeled as "lower sodium" or "no salt added." . Eat fresh foods. . Eat more vegetables, fruits, and low-fat dairy products. . Choose whole grains. Look for the word "whole" as the first word in the ingredient list. . Choose fish . Limit sweets, desserts, sugars, and sugary drinks. . Choose heart-healthy fats. . Eat veggie cheese . Eat more home-cooked food and less restaurant, buffet, and fast food. . Limit fried foods. Lacinda Axon foods using methods other than frying. . Limit canned vegetables. If you do use them, rinse them well to decrease the sodium. . When eating at a restaurant, ask that your food be prepared with less salt, or no salt if possible.   WHAT FOODS CAN I EAT? Read Dr Fara Olden Fuhrman's books on The End of Dieting & The End of Diabetes  Grains Whole grain or whole wheat bread. Brown rice. Whole grain or whole wheat pasta. Quinoa, bulgur, and whole grain cereals. Low-sodium cereals. Corn or whole wheat flour tortillas. Whole grain cornbread. Whole grain crackers. Low-sodium crackers.  Vegetables Fresh or frozen vegetables (raw, steamed, roasted, or grilled). Low-sodium or reduced-sodium tomato and vegetable juices. Low-sodium or reduced-sodium tomato sauce and paste. Low-sodium or reduced-sodium canned vegetables.  Fruits All fresh, canned (in natural juice), or frozen fruits.  Protein Products All fish and seafood. Dried beans, peas, or lentils. Unsalted nuts and seeds. Unsalted  canned beans.  Dairy Low-fat dairy products, such as skim or 1% milk, 2% or reduced-fat cheeses, low-fat ricotta or cottage cheese, or plain low-fat yogurt. Low-sodium or reduced-sodium cheeses.  Fats and Oils Tub margarines without trans fats.  Light or reduced-fat mayonnaise and salad dressings (reduced sodium). Avocado. Safflower, olive, or canola oils. Natural peanut or almond butter.  Other Unsalted popcorn and pretzels. The items listed above may not be a complete list of recommended foods or beverages. Contact your dietitian for more options.  ++++++++++++++++++++++++++++++++++++++++++++++++++++++++++++++++  WHAT FOODS ARE NOT RECOMMENDED?  Grains/ White flour or wheat flour White bread. White pasta. White rice. Refined cornbread. Bagels and croissants. Crackers that contain trans fat. Vegetables Creamed or fried vegetables. Vegetables in a . Regular canned vegetables. Regular canned tomato sauce and paste. Regular tomato and vegetable juices. Fruits Dried fruits. Canned fruit in light or heavy syrup. Fruit juice. Meat and Other Protein Products Meat in general - RED meat & White meat. Fatty cuts of meat. Ribs, chicken wings, all processed meats as bacon, sausage, bologna, salami, fatback, hot dogs, bratwurst and packaged luncheon meats. Dairy Whole or 2% milk, cream, half-and-half, and cream cheese. Whole-fat or sweetened yogurt. Full-fat cheeses or blue cheese. Non-dairy creamers and whipped toppings. Processed cheese, cheese spreads, or cheese curds.  Condiments Onion and garlic salt, seasoned salt, table salt, and sea salt. Canned and packaged gravies. Worcestershire sauce. Tartar sauce. Barbecue sauce. Teriyaki sauce. Soy sauce, including reduced sodium. Steak sauce. Fish sauce. Oyster sauce. Cocktail sauce. Horseradish. Ketchup and mustard. Meat flavorings and tenderizers. Bouillon cubes. Hot sauce. Tabasco sauce. Marinades. Taco seasonings. Relishes. Fats and Oils Butter,  stick margarine, lard, shortening and bacon fat. Coconut, palm kernel, or palm oils. Regular salad dressings. Pickles and olives. Salted popcorn and pretzels. The items listed above may not be a complete list of foods and beverages to avoid.

## 2017-02-24 NOTE — Progress Notes (Signed)
Impression and Recommendations:    1. Encounter for wellness examination   2. Vitamin D insufficiency   3. Health education/counseling   4. Obesity, Class I, BMI 30-34.9     Please see orders section below for further details of actions taken during this office visit.  Gross side effects, risk and benefits, and alternatives of medications discussed with patient.  Patient is aware that all medications have potential side effects and we are unable to predict every side effect or drug-drug interaction that may occur.  Expresses verbal understanding and consents to current therapy plan and treatment regiment.  1) Anticipatory Guidance: Discussed importance of wearing a seatbelt while driving, not texting while driving; sunscreen when outside along with yearly skin surveillance; eating a well balanced and modest diet; physical activity at least 25 minutes per day or 150 min/ week of moderate to intense activity.  2) Immunizations / Screenings / Labs:  All immunizations and screenings that patient agrees to, are up-to-date per recommendations or will be updated today.  Patient understands the needs for q 42mo dental and yearly vision screens which pt will schedule independently. Obtain CBC, CMP, HgA1c, Lipid panel, TSH and vit D when fasting if not already done recently.   3) Weight:   Discussed goal of losing even 5-10% of current body weight which would improve overall feelings of well being and improve objective health data significantly.   Improve nutrient density of diet through increasing intake of fruits and vegetables and decreasing saturated/trans fats, white flour products and refined sugar products.   F-up preventative CPE in 1 year. F/up sooner for chronic care management as discussed and/or prn.   No problem-specific Assessment & Plan notes found for this encounter.    No orders of the defined types were placed in this encounter.    Meds ordered this encounter    Medications  . Cholecalciferol (VITAMIN D3) 5000 units TABS    Sig: 5,000 IU OTC vitamin D3 daily.    Dispense:  90 tablet    Refill:  3     Discontinued Medications   No medications on file      Please see orders placed and AVS handed out to patient at the end of our visit for further patient instructions/ counseling done pertaining to today's office visit.     Subjective:    Chief Complaint  Patient presents with  . Annual Exam    HPI: Robin Barajas is a 41 y.o. female who [facility] a yearly health maintenance exam.  Health Maintenance Summary Reviewed and updated, unless pt declines services.  I reviewed all patient's recent labs with her today as well.  Recommend she take a daily vitamin D supplement to increase levels above 30.2 . Tobacco History Reviewed:   Y  CT scan for screening lung CA:   n/a Abdominal Ultrasound:     ( Unnecessary secondary to < 7465 or > 41 years old) Alcohol:    No concerns, no excessive use Exercise Habits:   Trying to meet AHA guidelines STD concerns:   None- one partner Drug Use:   None Birth control method:   n/a Menses regular:     Regular;  Has GYN;  Goes yrly for exams--> mammo as well.  Lumps or breast concerns:      No concerns- yrly exam thru GYN.  Breast Cancer Family History:      No   Health Maintenance  Topic Date Due  . PAP SMEAR  02/03/2018 (Originally 11/03/1996)  . TETANUS/TDAP  02/03/2018 (Originally 11/04/1994)  . HIV Screening  02/03/2029 (Originally 11/04/1990)  . INFLUENZA VACCINE  02/26/2017     Wt Readings from Last 3 Encounters:  02/24/17 202 lb 11.2 oz (91.9 kg)  02/03/17 205 lb 9.6 oz (93.3 kg)   BP Readings from Last 3 Encounters:  02/24/17 136/87  02/03/17 136/89   Pulse Readings from Last 3 Encounters:  02/24/17 83  02/03/17 (!) 52     Past Medical History:  Diagnosis Date  . Generalized headaches   . Hypertension 2003      Past Surgical  History:  Procedure Laterality Date  . APPENDECTOMY  1990  . ELBOW FRACTURE SURGERY  1984      Family History  Problem Relation Age of Onset  . Depression Mother   . Depression Sister   . Alcohol abuse Maternal Grandfather   . Cancer Paternal Grandmother        ovarian  . Diabetes Paternal Grandfather       History  Drug Use No  ,   History  Alcohol Use No  ,   History  Smoking Status  . Never Smoker  Smokeless Tobacco  . Never Used  ,   History  Sexual Activity  . Sexual activity: Yes    No current outpatient prescriptions on file prior to visit.   No current facility-administered medications on file prior to visit.     Allergies: Morphine and related and Codeine  Review of Systems: General:   Denies fever, chills, unexplained weight loss.  Optho/Auditory:   Denies visual changes, blurred vision/LOV Respiratory:   Denies SOB, DOE more than baseline levels.  Cardiovascular:   Denies chest pain, palpitations, new onset peripheral edema  Gastrointestinal:   Denies nausea, vomiting, diarrhea.  Genitourinary: Denies dysuria, freq/ urgency, flank pain or discharge from genitals.  Endocrine:     Denies hot or cold intolerance, polyuria, polydipsia. Musculoskeletal:   Denies unexplained myalgias, joint swelling, unexplained arthralgias, gait problems.  Skin:  Denies rash, suspicious lesions Neurological:     Denies dizziness, unexplained weakness, numbness  Psychiatric/Behavioral:   Denies mood changes, suicidal or homicidal ideations, hallucinations    Objective:    Blood pressure 136/87, pulse 83, height 5' 5.5" (1.664 m), weight 202 lb 11.2 oz (91.9 kg), last menstrual period 02/03/2017. Body mass index is 33.22 kg/m. General Appearance:    Alert, cooperative, no distress, appears stated age  Head:    Normocephalic, without obvious abnormality, atraumatic  Eyes:    PERRL, conjunctiva/corneas clear, EOM's intact, fundi    benign, both eyes  Ears:     Normal TM's and external ear canals, both ears  Nose:   Nares normal, septum midline, mucosa normal, no drainage    or sinus tenderness  Throat:   Lips w/o lesion, mucosa moist, and tongue normal; teeth and   gums normal  Neck:   Supple, symmetrical, trachea midline, no adenopathy;    thyroid:  no enlargement/tenderness/nodules; no carotid   bruit or JVD  Back:     Symmetric, no curvature, ROM normal, no CVA tenderness  Lungs:     Clear to auscultation bilaterally, respirations unlabored, no       Wh/ R/ R  Chest Wall:    No tenderness or gross deformity; normal excursion   Heart:    Regular rate and rhythm, S1 and S2 normal, no murmur, rub   or gallop  Breast Exam:    defer  Abdomen:     Soft, non-tender, bowel sounds active all four quadrants, NO   G/R/R, no masses, no organomegaly  Genitalia:  defer  Rectal:  defer  Extremities:   Extremities normal, atraumatic, no cyanosis or gross edema  Pulses:   2+ and symmetric all extremities  Skin:   Warm, dry, Skin color, texture, turgor normal, no obvious rashes or lesions Psych: No HI/SI, judgement and insight good, Euthymic mood. Full Affect.  Neurologic:   CNII-XII intact, normal strength, sensation and reflexes    Throughout

## 2017-03-11 ENCOUNTER — Ambulatory Visit (INDEPENDENT_AMBULATORY_CARE_PROVIDER_SITE_OTHER): Payer: PRIVATE HEALTH INSURANCE | Admitting: Family Medicine

## 2017-03-11 DIAGNOSIS — Z1211 Encounter for screening for malignant neoplasm of colon: Secondary | ICD-10-CM | POA: Diagnosis not present

## 2017-03-11 LAB — IFOBT (OCCULT BLOOD)
IFOBT: NEGATIVE
IFOBT: NEGATIVE
IFOBT: NEGATIVE

## 2017-03-11 NOTE — Progress Notes (Deleted)
IFOB cards tested.  Robin Barajas. Nelson, CMA

## 2017-03-13 NOTE — Progress Notes (Signed)
Pt returned IFOB x 3.  Results documented.  [name]. Lorraine Cimmino, CMA

## 2017-06-16 ENCOUNTER — Encounter: Payer: Self-pay | Admitting: Family Medicine

## 2017-06-16 ENCOUNTER — Telehealth: Payer: Self-pay | Admitting: Family Medicine

## 2017-06-16 ENCOUNTER — Ambulatory Visit (INDEPENDENT_AMBULATORY_CARE_PROVIDER_SITE_OTHER): Payer: PRIVATE HEALTH INSURANCE | Admitting: Family Medicine

## 2017-06-16 VITALS — BP 142/96 | HR 83 | Ht 65.5 in | Wt 204.3 lb

## 2017-06-16 DIAGNOSIS — L509 Urticaria, unspecified: Secondary | ICD-10-CM | POA: Diagnosis not present

## 2017-06-16 MED ORDER — PREDNISONE 10 MG (21) PO TBPK: ORAL_TABLET | ORAL | 0 refills | Status: DC

## 2017-06-16 NOTE — Telephone Encounter (Signed)
Pt states [facility] on [address] unable to fill Rx for prednisone/ Uni pak 21 tabs---- states they need provider/nurse to call them to clarify -- forwarding message to medical assistant--Pt states also that pharmacy sent over a paper request for correction-- u/a to locate. ---glh

## 2017-06-16 NOTE — Patient Instructions (Addendum)
Benadryl is an H1 blocker meaning it blocks histamine type I receptors and ranitidine is an H2 blocker blocking histamine type II receptors.  You can take these every day in addition to the prednisone if needed.     Angioedema Angioedema is the sudden swelling of tissue in the body. Angioedema can affect any part of the body, but it most often affects the deeper parts of the skin, causing red, itchy patches (hives) to appear over the affected area. It often begins during the night and is found in the morning. Depending on the cause, angioedema may happen:  Only once.  Several times. It may come back in unpredictable patterns.  Repeatedly for several years. Over time, it may gradually stop coming back.  Angioedema can be life-threatening if it affects the air passages that you breathe through. What are the causes? This condition may be caused by:  Foods, such as milk, eggs, shellfish, wheat, or nuts.  Certain medicines, such as ACE inhibitors, antibiotics, nonsteroidal anti-inflammatory drugs, birth control pills, or dyes used in X-rays.  Insect stings.  Infections.  Angioedema can be inherited, and episodes can be triggered by:  Mild injury.  Dental work.  Surgery.  Stress.  Sudden changes in temperature.  Exercise.  In some cases, the cause of this condition is not known. What are the signs or symptoms? Symptoms of this condition depend on where the swelling happens. Symptoms may include:  Swollen skin.  Red, itchy patches of skin (hives).  Redness in the affected area.  Pain in the affected area.  Swollen lips or tongue.  Wheezing.  Breathing problems.  If your internal organs are involved, symptoms may also include:  Nausea.  Abdominal pain.  Vomiting.  Difficulty swallowing.  Difficulty passing urine.  How is this diagnosed? This condition may be diagnosed based on:  An exam of the affected area.  Your medical history.  Whether anyone in  your family has had this condition before.  A review of any medicines you have been taking.  Tests, including: ? Allergy skin tests to see if the condition was caused by an allergic reaction. ? Blood tests to see if the condition was caused by a gene. ? Tests to check for underlying diseases that could cause the condition.  How is this treated? Treatment for this condition depends on the cause. It may involve any of the following:  If something triggered the condition, making changes to keep it from triggering the condition again.  If the condition affects your breathing, having tubes placed in your airway to keep it open.  Taking medicines to treat symptoms or prevent future episodes. These may include: ? Antihistamines. ? Epinephrine injections. ? Steroids.  If your condition is severe, you may need to be treated at the hospital. Angioedema usually gets better in 24-48 hours. Follow these instructions at home:  Take over-the-counter and prescription medicines only as told by your health care provider.  If you were given medicines for emergency allergy treatment, always carry them with you.  Wear a medical bracelet as told by your health care provider.  If something triggers your condition, avoid the trigger, if possible.  If your condition is inherited and you are thinking about having children, talk to your health care provider. It is important to discuss the risks of passing on the condition to your children. Contact a health care provider if:  You have repeated episodes of angioedema.  Episodes of angioedema start to happen more often than they  used to, even after you take steps to prevent them.  You have episodes of angioedema that are more severe than they have been before, even after you take steps to prevent them.  You are thinking about having children. Get help right away if:  You have severe swelling of your mouth, tongue, or lips.  You have trouble  breathing.  You have trouble swallowing.  You faint. This information is not intended to replace advice given to you by your health care provider. Make sure you discuss any questions you have with your health care provider. Document Released: 09/23/2001 Document Revised: 02/10/2016 Document Reviewed: 01/23/2016 Elsevier Interactive Patient Education  [employer]2018 [employer]

## 2017-06-16 NOTE — Progress Notes (Signed)
Pt here for an acute care OV today   Impression and Recommendations:    1. Urticarial rash    1. Stopped exposures #1 treatment 2. Steroids-risk benefits discussed with patient.  Explained cyclical nature of possible urticarial rashes, and that they can last weeks.   Education and routine counseling performed. Handouts provided  Gross side effects, risk and benefits, and alternatives of medications and treatment plan in general discussed with patient.  Patient is aware that all medications have potential side effects and we are unable to predict every side effect or drug-drug interaction that may occur.   Patient will call with any questions prior to using medication if they have concerns.  Expresses verbal understanding and consents to current therapy and treatment regimen.  No barriers to understanding were identified.  Red flag symptoms and signs discussed in detail.  Patient expressed understanding regarding what to do in case of emergency\urgent symptoms  Please see AVS handed out to patient at the end of our visit for further patient instructions/ counseling done pertaining to today's office visit.   Return if symptoms worsen or fail to improve.     Note: This document was prepared using Dragon voice recognition software and may include unintentional dictation errors.  Ryka Beighley 10:45 AM --------------------------------------------------------------------------------------------------------------------------------------------------------------------------------------------------------------------------------------------    Subjective:    CC:  Chief Complaint  Patient presents with  . Rash    entire body x 2 days     HPI: Emeline DarlingCynthia F Sluka is a 41 y.o. female who [facility] today for issues as discussed below.  Hives started sat night.  Intense itching esp at night.  Benadryl cream only tried.  No other fam members with  rash at all.    BP running at home- 130's/80's.    Wt Readings from Last 3 Encounters:  06/16/17 204 lb 4.8 oz (92.7 kg)  02/24/17 202 lb 11.2 oz (91.9 kg)  02/03/17 205 lb 9.6 oz (93.3 kg)   BP Readings from Last 3 Encounters:  06/16/17 (!) 142/96  02/24/17 136/87  02/03/17 136/89   BMI Readings from Last 3 Encounters:  06/16/17 33.48 kg/m  02/24/17 33.22 kg/m  02/03/17 33.69 kg/m     Patient Care Team    Relationship Specialty Notifications Start End  Thomasene Lotpalski, Gregoria Selvy, DO PCP - General Family Medicine  02/03/17   Candice CampLowe, David, MD Consulting Physician Obstetrics and Gynecology  02/03/17      Patient Active Problem List   Diagnosis Date Noted  . Elevated blood pressure reading- borderline many yrs 02/03/2017    Priority: High  . Obesity, Class I, BMI 30-34.9 02/03/2017    Priority: High  . History of diagnosis of MVP (mitral valve prolapse) 02/03/2017    Priority: Medium  . Heart palpitations- random times, lasts secs 02/03/2017    Priority: Medium  . Family history of depression 02/03/2017    Priority: Low  . personal history of depression 02/03/2017    Priority: Low  . Vitamin D insufficiency 02/24/2017  . History of appendectomy 02/03/2017  . Inactivity 02/03/2017    Past Medical history, Surgical history, Family history, Social history, Allergies and Medications have been entered into the medical record, reviewed and changed as needed.    Current Meds  Medication Sig  . Cholecalciferol (VITAMIN D3) 5000 units TABS 5,000 IU OTC vitamin D3 daily.    Allergies:  Allergies  Allergen Reactions  . Morphine And Related Shortness Of Breath and Nausea And  Vomiting  . Codeine Itching    Review of Systems: General:   Denies fever, chills, unexplained weight loss.  Optho/Auditory:   Denies visual changes, blurred vision/LOV Respiratory:   Denies wheeze, DOE more than baseline levels.  Cardiovascular:   Denies chest pain, palpitations, new onset peripheral  edema  Gastrointestinal:   Denies nausea, vomiting, diarrhea, abd pain.  Genitourinary: Denies dysuria, freq/ urgency, flank pain or discharge from genitals.  Endocrine:     Denies hot or cold intolerance, polyuria, polydipsia. Musculoskeletal:   Denies unexplained myalgias, joint swelling, unexplained arthralgias, gait problems.  Skin:  + rash, suspicious lesions Neurological:     Denies dizziness, unexplained weakness, numbness  Psychiatric/Behavioral:   Denies mood changes, suicidal or homicidal ideations, hallucinations   Objective:   Blood pressure (!) 142/96, pulse 83, height 5' 5.5" (1.664 m), weight 204 lb 4.8 oz (92.7 kg), last menstrual period 05/26/2017. Body mass index is 33.48 kg/m. General:  Well Developed, well nourished, appropriate for stated age.  Neuro:  Alert and oriented,  extra-ocular muscles intact  HEENT:  Normocephalic, atraumatic, neck supple Skin:  no gross rash, warm, pink.  Hives present on bilateral lower extremities in the distal feet-not palms of hands or feet.  And legs as well as bilateral upper arms Cardiac:  RRR, S1 S2 Respiratory:  ECTA B/L and A/P, Not using accessory muscles, speaking in full sentences- unlabored. Vascular:  Ext warm, no cyanosis apprec.; cap RF less 2 sec. Psych:  No HI/SI, judgement and insight good, Euthymic mood. Full Affect.

## 2017-06-17 ENCOUNTER — Other Ambulatory Visit: Payer: Self-pay | Admitting: Family Medicine

## 2017-06-17 ENCOUNTER — Telehealth: Payer: Self-pay | Admitting: Family Medicine

## 2017-06-17 DIAGNOSIS — L508 Other urticaria: Secondary | ICD-10-CM

## 2017-06-17 DIAGNOSIS — E559 Vitamin D deficiency, unspecified: Secondary | ICD-10-CM

## 2017-06-17 MED ORDER — PREDNISONE 20 MG PO TABS: ORAL_TABLET | ORAL | 0 refills | Status: DC

## 2017-06-17 MED ORDER — VITAMIN D3 125 MCG (5000 UT) PO TABS: ORAL_TABLET | ORAL | 3 refills | Status: DC

## 2017-06-17 NOTE — Telephone Encounter (Signed)
Spoke with pharmacy who states that the directions read "12 day taper pack" but the RX sent to pharmacy for a 6 day pack.  Which did you want the pt to have?  Please advise.  [name]. Nelson, CMA

## 2017-06-17 NOTE — Telephone Encounter (Signed)
LVM informing pt.  T. Nelson, CMA 

## 2017-06-17 NOTE — Telephone Encounter (Signed)
Patient left VM at lunch saying she is having difficulty getting her prednisone prescription and would like to speak with someone about this

## 2017-06-17 NOTE — Telephone Encounter (Signed)
Please call patient let her know I sent in a new prescription.  Tell her sorry but it was just a default in our system.

## 2017-08-04 ENCOUNTER — Ambulatory Visit (INDEPENDENT_AMBULATORY_CARE_PROVIDER_SITE_OTHER): Payer: PRIVATE HEALTH INSURANCE | Admitting: Family Medicine

## 2017-08-04 ENCOUNTER — Encounter: Payer: Self-pay | Admitting: Family Medicine

## 2017-08-04 VITALS — BP 139/100 | HR 66 | Ht 65.5 in | Wt 206.0 lb

## 2017-08-04 DIAGNOSIS — E669 Obesity, unspecified: Secondary | ICD-10-CM | POA: Diagnosis not present

## 2017-08-04 DIAGNOSIS — I10 Essential (primary) hypertension: Secondary | ICD-10-CM

## 2017-08-04 DIAGNOSIS — E559 Vitamin D deficiency, unspecified: Secondary | ICD-10-CM

## 2017-08-04 DIAGNOSIS — Z723 Lack of physical exercise: Secondary | ICD-10-CM | POA: Diagnosis not present

## 2017-08-04 MED ORDER — LOSARTAN POTASSIUM-HCTZ 50-12.5 MG PO TABS: 1.0000 | ORAL_TABLET | Freq: Every day | ORAL | 1 refills | Status: DC

## 2017-08-04 NOTE — Patient Instructions (Addendum)
Please start off taking 1/2 tablet of the blood pressure medicine daily.  This is different than what it says on your bottle.  Please check your blood pressure daily and write it down.  Keep this log and bring in next office visit.  Also check your blood pressure whenever you feel those visual spots or feel funny.  -

## 2017-08-04 NOTE — Progress Notes (Signed)
Impression and Recommendations:    1. Hypertension, unspecified type   2. Obesity, Class I, BMI 30-34.9   3. Inactivity   4. Vitamin D insufficiency     HTN - Blood pressure was up last appointment, and has been up since. - Medication recommended for HTN stage 2, especially given her visual changes. Options and potential side-effects were reviewed with the patient in great detail. Combination medication was recommended given synergy of mechanisms of action. - Losartan-HCTZ  prescribed. -Consider checking BMP next OV - The patient will keep a daily BP log to monitor HTN, making special note of visual changes, headache, lightheadedness, or other symptoms. - Exercise encouraged to help with blood pressure as well.  - wt loss encouraged  Obesity, Inactivity, & Weight Loss Management - Patient encouraged to work toward 150 minutes of cardiovascular activities as per [facility] guidelines. - Advised to obtain access to exercise equipment, such as a treadmill. - Mediterranean diet of fish, lean proteins, fruits and veggies recommended. - Patient encouraged to come in for OV to discuss weight goals and habit changes if desired.  Vitamin D - Continue taking supplement.  Pt will return in 4 weeks for evaluation of new BP medication.    Meds ordered this encounter  Medications  . losartan-hydrochlorothiazide (HYZAAR) 50-12.5 MG tablet    Sig: Take 1 tablet by mouth daily.    Dispense:  90 tablet    Refill:  1    Gross side effects, risk and benefits, and alternatives of medications and treatment plan in general discussed with patient.  Patient is aware that all medications have potential side effects and we are unable to predict every side effect or drug-drug interaction that may occur.   Patient will call with any questions prior to using medication if they have concerns.  Expresses verbal understanding and consents to current therapy and treatment regimen.  No barriers to  understanding were identified.  Red flag symptoms and signs discussed in detail.  Patient expressed understanding regarding what to do in case of emergency\urgent symptoms  Please see AVS handed out to patient at the end of our visit for further patient instructions/ counseling done pertaining to today's office visit.   Return for 4wks- f/up new BP med.    Note: This note was prepared with assistance of Dragon voice recognition software. Occasional wrong-word or sound-a-like substitutions may have occurred due to the inherent limitations of voice recognition software.   This document serves as a record of services personally performed by Thomasene Loteborah Shuntay Everetts, DO. It was created on her behalf by Peggye FothergillKatherine Galloway, a trained medical scribe. The creation of this record is based on the scribe's personal observations and the provider's statements to them.   I have reviewed the above medical documentation for accuracy and completeness and I concur.  Thomasene LotDeborah Mischele Detter 08/04/17 12:02 PM   --------------------------------------------------------------------------------------------------------------------------------------------------------------------------------------------------------------------------------------------    Subjective:     HPI: Robin Barajas is a 42 y.o. female [facility] issues as discussed below.  Overall she has been good. She had an enjoyable holiday season.  Elevated Blood Pressure Patient occasionally checks blood pressure when she has vision changes, described as "dots" moving around.  She had gone to her eye doctor for complete evaluation and he told her exam was normal and dots were likely from her elevated blood pressure.  This was back at the end of the summer.  She has kept a  log noted below- checks blood pressure only once every 2-3 weeks..  BP log per patient: 135/88 with vision change 145/96   139/74 153/96 146/92 136/88  Obesity, Inactivity, & Weight Loss Management Patient notes that she is still walking, and very active at work at [employer], but does not measure her level of activity.  He is to have a fit bit but band broke so she does not use it.  Not hitting AHA guidelines for exercise. -Patient up 2 pounds from prior OV.  Wt Readings from Last 3 Encounters:  08/04/17 206 lb (93.4 kg)  06/16/17 204 lb 4.8 oz (92.7 kg)  02/24/17 202 lb 11.2 oz (91.9 kg)   BP Readings from Last 3 Encounters:  08/04/17 (!) 139/100  06/16/17 (!) 142/96  02/24/17 136/87   Pulse Readings from Last 3 Encounters:  08/04/17 66  06/16/17 83  02/24/17 83   BMI Readings from Last 3 Encounters:  08/04/17 33.76 kg/m  06/16/17 33.48 kg/m  02/24/17 33.22 kg/m     Patient Care Team    Relationship Specialty Notifications Start End  Thomasene Lot, DO PCP - General Family Medicine  02/03/17   Candice Camp, MD Consulting Physician Obstetrics and Gynecology  02/03/17      Patient Active Problem List   Diagnosis Date Noted  . Elevated blood pressure reading- borderline many yrs 02/03/2017    Priority: High  . Obesity, Class I, BMI 30-34.9 02/03/2017    Priority: High  . History of diagnosis of MVP (mitral valve prolapse) 02/03/2017    Priority: Medium  . Heart palpitations- random times, lasts secs 02/03/2017    Priority: Medium  . Vitamin D insufficiency 02/24/2017    Priority: Low  . Family history of depression 02/03/2017    Priority: Low  . personal history of depression 02/03/2017    Priority: Low  . Inactivity 02/03/2017    Priority: Low  . History of appendectomy 02/03/2017    Past Medical history, Surgical history, Family history, Social history, Allergies and Medications have been entered into the medical record, reviewed and changed as needed.    Current Meds  Medication Sig  . Cholecalciferol (VITAMIN D3) 5000 units TABS 5,000 IU OTC vitamin D3 daily.     Allergies:  Allergies  Allergen Reactions  . Morphine And Related Shortness Of Breath and Nausea And Vomiting  . Codeine Itching     Review of Systems:  A fourteen system review of systems was performed and found to be positive as per HPI.   Objective:   Blood pressure (!) 139/100, pulse 66, height 5' 5.5" (1.664 m), weight 206 lb (93.4 kg), last menstrual period 07/21/2017, SpO2 99 %. Body mass index is 33.76 kg/m. General:  Well Developed, well nourished, appropriate for stated age.  Neuro:  Alert and oriented,  extra-ocular muscles intact  HEENT:  Normocephalic, atraumatic, neck supple, no carotid bruits appreciated  Skin:  no gross rash, warm, pink. Cardiac:  RRR, S1 S2 Respiratory:  ECTA B/L and A/P, Not using accessory muscles, speaking in full sentences- unlabored. Vascular:  Ext warm, no cyanosis apprec.; cap RF less 2 sec. Psych:  No HI/SI, judgement and insight good, Euthymic mood. Full Affect.

## 2017-09-02 ENCOUNTER — Encounter: Payer: Self-pay | Admitting: Adult Health

## 2017-09-02 ENCOUNTER — Encounter: Payer: Self-pay | Admitting: Family Medicine

## 2017-09-02 ENCOUNTER — Ambulatory Visit (INDEPENDENT_AMBULATORY_CARE_PROVIDER_SITE_OTHER): Payer: PRIVATE HEALTH INSURANCE | Admitting: Adult Health

## 2017-09-02 VITALS — BP 124/80 | HR 99 | Temp 98.5°F | Ht 65.5 in | Wt 203.8 lb

## 2017-09-02 DIAGNOSIS — J019 Acute sinusitis, unspecified: Secondary | ICD-10-CM | POA: Diagnosis not present

## 2017-09-02 MED ORDER — ONDANSETRON HCL 4 MG PO TABS: 4.0000 mg | ORAL_TABLET | Freq: Three times a day (TID) | ORAL | 0 refills | Status: DC | PRN

## 2017-09-02 MED ORDER — AMOXICILLIN-POT CLAVULANATE 875-125 MG PO TABS: 1.0000 | ORAL_TABLET | Freq: Two times a day (BID) | ORAL | 0 refills | Status: DC

## 2017-09-02 NOTE — Progress Notes (Signed)
Subjective:    Patient ID: Robin Barajas, female    DOB: 06-11-1976, 42 y.o.   MRN: [id number]  HPI:  Robin Barajas presents with green/thick nasal drainage, productive cough (clear/thin mucus), sore throat (2/10) that all started >9 days ago and has been steadily worsening since then.  She denies CP/dyspnea/palpitations/fever/night sweats/V/D, however reports mild nausea without vomiting that began this morning. 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 She has been using OTC DayQuil, NyQuil,and "herbal remedies" with only minor sx relief.  Patient Care Team    Relationship Specialty Notifications Start End  Thomasene Lot, DO PCP - General Family Medicine  02/03/17   Candice Camp, MD Consulting Physician Obstetrics and Gynecology  02/03/17     Patient Active Problem List   Diagnosis Date Noted  . Vitamin D insufficiency 02/24/2017  . Elevated blood pressure reading- borderline many yrs 02/03/2017  . History of diagnosis of MVP (mitral valve prolapse) 02/03/2017  . Obesity, Class I, BMI 30-34.9 02/03/2017  . Family history of depression 02/03/2017  . personal history of depression 02/03/2017  . Heart palpitations- random times, lasts secs 02/03/2017  . History of appendectomy 02/03/2017  . Inactivity 02/03/2017     Past Medical History:  Diagnosis Date  . Generalized headaches   . Hypertension 2003     Past Surgical History:  Procedure Laterality Date  . APPENDECTOMY  1990  . ELBOW FRACTURE SURGERY  1984     Family History  Problem Relation Age of Onset  . Depression Mother   . Depression Sister   . Alcohol abuse Maternal Grandfather   . Cancer Paternal Grandmother        ovarian  . Diabetes Paternal Grandfather      Social History   Substance and Sexual Activity  Drug Use No     Social History   Substance and Sexual Activity  Alcohol Use No     Social History   Tobacco Use  Smoking Status Never Smoker  Smokeless Tobacco  Never Used     Outpatient Encounter Medications as of 09/02/2017  Medication Sig  . Cholecalciferol (VITAMIN D3) 5000 units TABS 5,000 IU OTC vitamin D3 daily.  Marland Kitchen losartan-hydrochlorothiazide (HYZAAR) 50-12.5 MG tablet Take 1 tablet by mouth daily.   No facility-administered encounter medications on file as of 09/02/2017.     Allergies: Morphine and related and Codeine  Body mass index is 33.4 kg/m.  Blood pressure 124/80, pulse 99, temperature 98.5 F (36.9 C), temperature source Oral, height 5' 5.5" (1.664 m), weight 203 lb 12.8 oz (92.4 kg), last menstrual period 08/10/2017, SpO2 100 %.     Review of Systems  Constitutional: Positive for activity change, appetite change and fatigue. Negative for chills, diaphoresis, fever and unexpected weight change.  HENT: Positive for congestion, postnasal drip, rhinorrhea, sinus pressure, sinus pain and sore throat. Negative for trouble swallowing and voice change.   Eyes: Negative for visual disturbance.  Respiratory: Positive for cough. Negative for chest tightness, shortness of breath, wheezing and stridor.   Cardiovascular: Negative for chest pain, palpitations and leg swelling.  Gastrointestinal: Positive for nausea. Negative for abdominal distention, abdominal pain, blood in stool, constipation, diarrhea and vomiting.  Endocrine: Negative for cold intolerance, heat intolerance, polydipsia, polyphagia and polyuria.  Genitourinary: Negative for difficulty urinating and flank pain.  Neurological: Positive for headaches. Negative for dizziness.  Hematological: Does not bruise/bleed easily.  Psychiatric/Behavioral: Positive for sleep disturbance.       Objective:  Physical Exam  Constitutional: She is oriented to person, place, and time. She appears well-developed and well-nourished. No distress.  HENT:  Head: Normocephalic and atraumatic.  Right Ear: Hearing, external ear and ear canal normal. Tympanic membrane is bulging. Tympanic  membrane is not erythematous. No decreased hearing is noted.  Left Ear: Hearing, external ear and ear canal normal. Tympanic membrane is bulging. Tympanic membrane is not erythematous. No decreased hearing is noted.  Nose: Mucosal edema and rhinorrhea present. Right sinus exhibits maxillary sinus tenderness and frontal sinus tenderness. Left sinus exhibits maxillary sinus tenderness and frontal sinus tenderness.  Mouth/Throat: Uvula is midline and mucous membranes are normal. Posterior oropharyngeal edema and posterior oropharyngeal erythema present. No oropharyngeal exudate or tonsillar abscesses.  Eyes: Conjunctivae are normal. Pupils are equal, round, and reactive to light.  Neck: Normal range of motion. Neck supple.  Cardiovascular: Normal rate, regular rhythm and intact distal pulses.  No murmur heard. Pulmonary/Chest: Effort normal and breath sounds normal. No respiratory distress. She has no wheezes. She has no rales. She exhibits no tenderness.  Lymphadenopathy:    She has no cervical adenopathy.  Neurological: She is alert and oriented to person, place, and time.  Skin: Skin is warm and dry. No rash noted. She is not diaphoretic. No erythema. No pallor.  Psychiatric: She has a normal mood and affect. Her behavior is normal. Judgment and thought content normal.  Nursing note and vitals reviewed.         Assessment & Plan:   1. Acute sinusitis, recurrence not specified, unspecified location     Acute sinusitis Please take Augmentin 875mg  twice daily with food, take for 10 days. Increase fluids/rest/vit-c, 2,000mg /day Use Zofran as needed for nausea/vomiting. Work Excuse provided, okay to return 09/04/17 If symptoms persist after antibiotic completed, then please call clinic.    FOLLOW-UP:  Return if symptoms worsen or fail to improve.

## 2017-09-02 NOTE — Assessment & Plan Note (Signed)
Please take Augmentin 875mg  twice daily with food, take for 10 days. Increase fluids/rest/vit-c, 2,000mg /day Use Zofran as needed for nausea/vomiting. Work Excuse provided, okay to return 09/04/17 If symptoms persist after antibiotic completed, then please call clinic.

## 2017-09-02 NOTE — Patient Instructions (Signed)

## 2017-09-08 ENCOUNTER — Ambulatory Visit: Payer: PRIVATE HEALTH INSURANCE | Admitting: Family Medicine

## 2017-09-17 ENCOUNTER — Ambulatory Visit (INDEPENDENT_AMBULATORY_CARE_PROVIDER_SITE_OTHER): Payer: PRIVATE HEALTH INSURANCE | Admitting: Family Medicine

## 2017-09-17 ENCOUNTER — Encounter: Payer: Self-pay | Admitting: Family Medicine

## 2017-09-17 VITALS — BP 130/88 | HR 77 | Ht 65.5 in | Wt 203.8 lb

## 2017-09-17 DIAGNOSIS — E669 Obesity, unspecified: Secondary | ICD-10-CM

## 2017-09-17 DIAGNOSIS — E559 Vitamin D deficiency, unspecified: Secondary | ICD-10-CM

## 2017-09-17 DIAGNOSIS — R51 Headache: Secondary | ICD-10-CM

## 2017-09-17 DIAGNOSIS — I10 Essential (primary) hypertension: Secondary | ICD-10-CM | POA: Insufficient documentation

## 2017-09-17 DIAGNOSIS — Z723 Lack of physical exercise: Secondary | ICD-10-CM | POA: Diagnosis not present

## 2017-09-17 DIAGNOSIS — R519 Headache, unspecified: Secondary | ICD-10-CM | POA: Insufficient documentation

## 2017-09-17 MED ORDER — LOSARTAN POTASSIUM-HCTZ 100-12.5 MG PO TABS: 0.5000 | ORAL_TABLET | Freq: Every day | ORAL | 3 refills | Status: DC

## 2017-09-17 NOTE — Patient Instructions (Addendum)
6 bottles of water is minimum for someone your age height etc.  Also for every 30 minutes of exercise at 1 bottle of water.  This is a 16.9 ounce bottle of water  Continue to monitor your blood pressure at home our goal is to be consistently under 130/80.  lose it app- track everything you put into your mouth.  Even if you have a bite of a candy bar to put that candy bar and there and say you ate 1/10 of it or what ever it is.    -You are more than welcome to come 4 weeks up to 4 months as you desire so I can support you with your weight loss goals and/or with your healthier habit goals      Behavior Modification Ideas for Weight Management  Weight management involves adopting a healthy lifestyle that includes a knowledge of nutrition and exercise, a positive attitude and the right kind of motivation. Internal motives such as better health, increased energy, self-esteem and personal control increase your chances of lifelong weight management success.  Remember to have realistic goals and think long-term success. Believe in yourself and you can do it. The following information will give you ideas to help you meet your goals.  Control Your Home Environment  Eat only while sitting down at the kitchen or dining room table. Do not eat while watching television, reading, cooking, talking on the phone, standing at the refrigerator or working on the computer. Keep tempting foods out of the house - don't buy them. Keep tempting foods out of sight. Have low-calorie foods ready to eat. Unless you are preparing a meal, stay out of the kitchen. Have healthy snacks at your disposal, such as small pieces of fruit, vegetables, canned fruit, pretzels, low-fat string cheese and nonfat cottage cheese.  Control Your Work Environment  Do not eat at [employer] or keep tempting snacks at your desk. If you get hungry between meals, plan healthy snacks and bring them with you to work. During your breaks, go for  a walk instead of eating. If you work around food, plan in advance the one item you will eat at mealtime. Make it inconvenient to nibble on food by chewing gum, sugarless candy or drinking water or another low-calorie beverage. Do not work through meals. Skipping meals slows down metabolism and may result in overeating at the next meal. If food is available for special occasions, either pick the healthiest item, nibble on low-fat snacks brought from home, don't have anything offered, choose one option and have a small amount, or have only a beverage.  Control Your Mealtime Environment  Serve your plate of food at the stove or kitchen counter. Do not put the serving dishes on the table. If you do put dishes on the table, remove them immediately when finished eating. Fill half of your plate with vegetables, a quarter with lean protein and a quarter with starch. Use smaller plates, bowls and glasses. A smaller portion will look large when it is in a little dish. Politely refuse second helpings. When fixing your plate, limit portions of food to one scoop/serving or less.   Daily Food Management  Replace eating with another activity that you will not associate with food. Wait 20 minutes before eating something you are craving. Drink a large glass of water or diet soda before eating. Always have a big glass or bottle of water to drink throughout the day. Avoid high-calorie add-ons such as cream with your coffee,  butter, mayonnaise and salad dressings.  Shopping: Do not shop when hungry or tired. Shop from a list and avoid buying anything that is not on your list. If you must have tempting foods, buy individual-sized packages and try to find a lower-calorie alternative. Don't taste test in the store. Read food labels. Compare products to help you make the healthiest choices.  Preparation: Chew a piece of gum while cooking meals. Use a quarter teaspoon if you taste test your food. Try to  only fix what you are going to eat, leaving yourself no chance for seconds. If you have prepared more food than you need, portion it into individual containers and freeze or refrigerate immediately. Don't snack while cooking meals.  Eating: Eat slowly. Remember it takes about 20 minutes for your stomach to send a message to your brain that it is full. Don't let fake hunger make you think you need more. The ideal way to eat is to take a bite, put your utensil down, take a sip of water, cut your next bite, take a bit, put your utensil down and so on. Do not cut your food all at one time. Cut only as needed. Take small bites and chew your food well. Stop eating for a minute or two at least once during a meal or snack. Take breaks to reflect and have conversation.  Cleanup and Leftovers: Label leftovers for a specific meal or snack. Freeze or refrigerate individual portions of leftovers. Do not clean up if you are still hungry.  Eating Out and Social Eating  Do not arrive hungry. Eat something light before the meal. Try to fill up on low-calorie foods, such as vegetables and fruit, and eat smaller portions of the high-calorie foods. Eat foods that you like, but choose small portions. If you want seconds, wait at least 20 minutes after you have eaten to see if you are actually hungry or if your eyes are bigger than your stomach. Limit alcoholic beverages. Try a soda water with a twist of lime. Do not skip other meals in the day to save room for the special event.  At Restaurants: Order  la carte rather than buffet style. Order some vegetables or a salad for an appetizer instead of eating bread. If you order a high-calorie dish, share it with someone. Try an after-dinner mint with your coffee. If you do have dessert, share it with two or more people. Don't overeat because you do not want to waste food. Ask for a doggie bag to take extra food home. Tell the server to put half of your entree  in a to go bag before the meal is served to you. Ask for salad dressing, gravy or high-fat sauces on the side. Dip the tip of your fork in the dressing before each bite. If bread is served, ask for only one piece. Try it plain without butter or oil. At [employer] where oil and vinegar is served with bread, use only a small amount of oil and a lot of vinegar for dipping.  At a Friend's House: Offer to bring a dish, appetizer or dessert that is low in calories. Serve yourself small portions or tell the host that you only want a small amount. Stand or sit away from the snack table. Stay away from the kitchen or stay busy if you are near the food. Limit your alcohol intake.  At [employer] and Cafeterias: Cover most of your plate with lettuce and/or vegetables. Use a salad plate instead  of a dinner plate. After eating, clear away your dishes before having coffee or tea.  Entertaining at Home: Explore low-fat, low-cholesterol cookbooks. Use single-serving foods like chicken breasts or hamburger patties. Prepare low-calorie appetizers and desserts.   Holidays: Keep tempting foods out of sight. Decorate the house without using food. Have low-calorie beverages and foods on hand for guests. Allow yourself one planned treat a day. Don't skip meals to save up for the holiday feast. Eat regular, planned meals.   Exercise Well  Make exercise a priority and a planned activity in the day. If possible, walk the entire or part of the distance to work. Get an exercise buddy. Go for a walk with a colleague during one of your breaks, go to the gym, run or take a walk with a friend, walk in the mall with a shopping companion. Park at the end of the parking lot and walk to the store or office entrance. Always take the stairs all of the way or at least part of the way to your floor. If you have a desk job, walk around the office frequently. Do leg lifts while sitting at your desk. Do something  outside on the weekends like going for a hike or a bike ride.   Have a Healthy Attitude  Make health your weight management priority. Be realistic. Have a goal to achieve a healthier you, not necessarily the lowest weight or ideal weight based on calculations or tables. Focus on a healthy eating style, not on dieting. Dieting usually lasts for a short amount of time and rarely produces long-term success. Think long term. You are developing new healthy behaviors to follow next month, in a year and in a decade.    This information is for educational purposes only and is not intended to replace the advice of your doctor or health care provider. We encourage you to discuss with your doctor any questions or concerns you may have.        Guidelines for Losing Weight   We want weight loss that will last so you should lose 1-2 pounds a week.  THAT IS IT! Please pick THREE things a month to change. Once it is a habit check off the item. Then pick another three items off the list to become habits.  If you are already doing a habit on the list GREAT!  Cross that item off!  Don't drink your calories. Ie, alcohol, soda, fruit juice, and sweet tea.   Drink more water. Drink a glass when you feel hungry or before each meal.   Eat breakfast - Complex carb and protein (likeDannon light and fit yogurt, oatmeal, fruit, eggs, [place] bacon).  Measure your cereal.  Eat no more than one cup a day. (ie Kashi)  Eat an apple a day.  Add a vegetable a day.  Try a new vegetable a month.  Use Pam! Stop using oil or butter to cook.  Don't finish your plate or use smaller plates.  Share your dessert.  Eat sugar free Jello for dessert or frozen grapes.  Don't eat 2-3 hours before bed.  Switch to whole wheat bread, pasta, and brown rice.  Make healthier choices when you eat out. No fries!  Pick baked chicken, NOT fried.  Don't forget to SLOW DOWN when you eat. It is not going anywhere.   Take  the stairs.  Park far away in the parking lot  Lift soup cans (or weights) for 10 minutes while watching TV.  Walk at work for 10 minutes during break.  Walk outside 1 time a week with your friend, kids, dog, or significant other.  Start a walking group at church.  Walk the mall as much as you can tolerate.   Keep a food diary.  Weigh yourself daily.  Walk for 15 minutes 3 days per week.  Cook at home more often and eat out less. If life happens and you go back to old habits, it is okay.  Just start over. You can do it!  If you experience chest pain, get short of breath, or tired during the exercise, please stop immediately and inform your doctor.    Before you even begin to attack a weight-loss plan, it pays to remember this: You are not fat. You have fat. Losing weight isn't about blame or shame; it's simply another achievement to accomplish. Dieting is like any other skill-you have to buckle down and work at it. As long as you act in a smart, reasonable way, you'll ultimately get where you want to be. Here are some weight loss pearls for you.   1. It's Not a Diet. It's a Lifestyle Thinking of a diet as something you're on and suffering through only for the short term doesn't work. To shed weight and keep it off, you need to make permanent changes to the way you eat. It's OK to indulge occasionally, of course, but if you cut calories temporarily and then revert to your old way of eating, you'll gain back the weight quicker than you can say yo-yo. Use it to lose it. Research shows that one of the best predictors of long-term weight loss is how many pounds you drop in the first month. For that reason, nutritionists often suggest being stricter for the first two weeks of your new eating strategy to build momentum. Cut out added sugar and alcohol and avoid unrefined carbs. After that, figure out how you can reincorporate them in a way that's healthy and maintainable.  2. There's a Right  Way to Exercise Working out burns calories and fat and boosts your metabolism by building muscle. But those trying to lose weight are notorious for overestimating the number of calories they burn and underestimating the amount they take in. Unfortunately, your system is biologically programmed to hold on to extra pounds and that means when you start exercising, your body senses the deficit and ramps up its hunger signals. If you're not diligent, you'll eat everything you burn and then some. Use it, to lose it. Cardio gets all the exercise glory, but strength and interval training are the real heroes. They help you build lean muscle, which in turn increases your metabolism and calorie-burning ability 3. Don't Overreact to Mild Hunger Some people have a hard time losing weight because of hunger anxiety. To them, being hungry is bad-something to be avoided at all costs-so they carry snacks with them and eat when they don't need to. Others eat because they're stressed out or bored. While you never want to get to the point of being ravenous (that's when bingeing is likely to happen), a hunger pang, a craving, or the fact that it's 3:00 p.m. should not send you racing for the vending machine or obsessing about the energy bar in your purse. Ideally, you should put off eating until your stomach is growling and it's difficult to concentrate.  Use it to lose it. When you feel the urge to eat, use the HALT method. Ask yourself, Am I  really hungry? Or am I angry or anxious, lonely or bored, or tired? If you're still not certain, try the apple test. If you're truly hungry, an apple should seem delicious; if it doesn't, something else is going on. Or you can try drinking water and making yourself busy, if you are still hungry try a healthy snack.  4. Not All Calories Are Created Equal The mechanics of weight loss are pretty simple: Take in fewer calories than you use for energy. But the kind of food you eat makes all the  difference. Processed food that's high in saturated fat and refined starch or sugar can cause inflammation that disrupts the hormone signals that tell your brain you're full. The result: You eat a lot more.  Use it to lose it. Clean up your diet. Swap in whole, unprocessed foods, including vegetables, lean protein, and healthy fats that will fill you up and give you the biggest nutritional bang for your calorie buck. In a few weeks, as your brain starts receiving regular hunger and fullness signals once again, you'll notice that you feel less hungry overall and naturally start cutting back on the amount you eat.  5. Protein, Produce, and Plant-Based Fats Are Your Weight-Loss Trinity Here's why eating the three Ps regularly will help you drop pounds. Protein fills you up. You need it to build lean muscle, which keeps your metabolism humming so that you can torch more fat. People in a weight-loss program who ate double the recommended daily allowance for protein (about 110 grams for a 150-pound woman) lost 70 percent of their weight from fat, while people who ate the RDA lost only about 40 percent, one study found. Produce is packed with filling fiber. "It's very difficult to consume too many calories if you're eating a lot of vegetables. Example: Three cups of broccoli is a lot of food, yet only 93 calories. (Fruit is another story. It can be easy to overeat and can contain a lot of calories from sugar, so be sure to monitor your intake.) Plant-based fats like olive oil and those in avocados and nuts are healthy and extra satiating.  Use it to lose it. Aim to incorporate each of the three Ps into every meal and snack. People who eat protein throughout the day are able to keep weight off, according to a study in the American Journal of Clinical Nutrition. In addition to meat, poultry and seafood, good sources are beans, lentils, eggs, tofu, and yogurt. As for fat, keep portion sizes in check by measuring out  salad dressing, oil, and nut butters (shoot for one to two tablespoons). Finally, eat veggies or a little fruit at every meal. People who did that consumed 308 fewer calories but didn't feel any hungrier than when they didn't eat more produce.  7. How You Eat Is As Important As What You Eat In order for your brain to register that you're full, you need to focus on what you're eating. Sit down whenever you eat, preferably at a table. Turn off the TV or computer, put down your phone, and look at your food. Smell it. Chew slowly, and don't put another bite on your fork until you swallow. When women ate lunch this attentively, they consumed 30 percent less when snacking later than those who listened to an audiobook at lunchtime, according to a study in the [place] Journal of Nutrition. 8. Weighing Yourself Really Works The scale provides the best evidence about whether your efforts are paying off. Seeing  the numbers tick up or down or stagnate is motivation to keep going-or to rethink your approach. A 2015 study at [facility] found that daily weigh-ins helped people lose more weight, keep it off, and maintain that loss, even after two years. Use it to lose it. Step on the scale at the same time every day for the best results. If your weight shoots up several pounds from one weigh-in to the next, don't freak out. Eating a lot of salt the night before or having your period is the likely culprit. The number should return to normal in a day or two. It's a steady climb that you need to do something about. 9. Too Much Stress and Too Little Sleep Are Your Enemies When you're tired and frazzled, your body cranks up the production of cortisol, the stress hormone that can cause carb cravings. Not getting enough sleep also boosts your levels of ghrelin, a hormone associated with hunger, while suppressing leptin, a hormone that signals fullness and satiety. People on a diet who slept only five and a half hours a  night for two weeks lost 55 percent less fat and were hungrier than those who slept eight and a half hours, according to a study in the [place] [employer] Journal. Use it to lose it. Prioritize sleep, aiming for seven hours or more a night, which research shows helps lower stress. And make sure you're getting quality zzz's. If a snoring spouse or a fidgety cat wakes you up frequently throughout the night, you may end up getting the equivalent of just four hours of sleep, according to a study from [facility]. Keep pets out of the bedroom, and use a white-noise app to drown out snoring. 10. You Will Hit a plateau-And You Can Bust Through It As you slim down, your body releases much less leptin, the fullness hormone.  If you're not strength training, start right now. Building muscle can raise your metabolism to help you overcome a plateau. To keep your body challenged and burning calories, incorporate new moves and more intense intervals into your workouts or add another sweat session to your weekly routine. Alternatively, cut an extra 100 calories or so a day from your diet. Now that you've lost weight, your body simply doesn't need as much fuel.    Since food equals calories, in order to lose weight you must either eat fewer calories, exercise more to burn off calories with activity, or both. Food that is not used to fuel the body is stored as fat. A major component of losing weight is to make smarter food choices. Here's how:  1)   Limit non-nutritious foods, such as: Sugar, honey, syrups and candy Pastries, donuts, pies, cakes and cookies Soft drinks, sweetened juices and alcoholic beverages  2)  Cut down on high-fat foods by: - Choosing poultry, fish or lean red meat - Choosing low-fat cooking methods, such as baking, broiling, steaming, grilling and boiling - Using low-fat or non-fat dairy products - Using vinaigrette, herbs, lemon or fat-free salad dressings - Avoiding  fatty meats, such as bacon, sausage, franks, ribs and luncheon meats - Avoiding high-fat snacks like nuts, chips and chocolate - Avoiding fried foods - Using less butter, margarine, oil and mayonnaise - Avoiding high-fat gravies, cream sauces and cream-based soups  3) Eat a variety of foods, including: - Fruit and vegetables that are raw, steamed or baked - Whole grains, breads, cereal, rice and pasta - Dairy products, such as low-fat or  non-fat milk or yogurt, low-fat cottage cheese and low-fat cheese - Protein-rich foods like chicken, [place], fish, lean meat and legumes, or beans  4) Change your eating habits by: - Eat three balanced meals a day to help control your hunger - Watch portion sizes and eat small servings of a variety of foods - Choose low-calorie snacks - Eat only when you are hungry and stop when you are satisfied - Eat slowly and try not to perform other tasks while eating - Find other activities to distract you from food, such as walking, taking up a hobby or being involved in the community - Include regular exercise in your daily routine ( minimum of 20 min of moderate-intensity exercise at least 5 days/week)  - Find a support group, if necessary, for emotional support in your weight loss journey           Easy ways to cut 100 calories   1. Eat your eggs with hot sauce OR salsa instead of cheese.  Eggs are great for breakfast, but many people consider eggs and cheese to be BFFs. Instead of cheese-1 oz. of cheddar has 114 calories-top your eggs with hot sauce, which contains no calories and helps with satiety and metabolism. Salsa is also a great option!!  2. Top your toast, waffles or pancakes with fresh berries instead of jelly or syrup. Half a cup of berries-fresh, frozen or thawed-has about 40 calories, compared with 2 tbsp. of maple syrup or jelly, which both have about 100 calories. The berries will also give you a good punch of fiber, which helps keep you  full and satisfied and won't spike blood sugar quickly like the jelly or syrup. 3. Swap the non-fat latte for black coffee with a splash of half-and-half. Contrary to its name, that non-fat latte has 130 calories and a startling 19g of carbohydrates per 16 oz. serving. Replacing that 'light' drinkable dessert with a black coffee with a splash of half-and-half saves you more than 100 calories per 16 oz. serving. 4. Sprinkle salads with freeze-dried raspberries instead of dried cranberries. If you want a sweet addition to your nutritious salad, stay away from dried cranberries. They have a whopping 130 calories per  cup and 30g carbohydrates. Instead, sprinkle freeze-dried raspberries guilt-free and save more than 100 calories per  cup serving, adding 3g of belly-filling fiber. 5. Go for mustard in place of mayo on your sandwich. Mustard can add really nice flavor to any sandwich, and there are tons of varieties, from spicy to honey. A serving of mayo is 95 calories, versus 10 calories in a serving of mustard.  Or try an avocado mayo spread: You can find the recipe few click this link: https://www.californiaavocado.com/recipes/recipe-container/[place]ifornia-avocado-mayo 6. Choose a DIY salad dressing instead of the store-bought kind. Mix Dijon or whole grain mustard with low-fat Kefir or red wine vinegar and garlic. 7. Use hummus as a spread instead of a dip. Use hummus as a spread on a high-fiber cracker or tortilla with a sandwich and save on calories without sacrificing taste. 8. Pick just one salad "accessory." Salad isn't automatically a calorie winner. It's easy to over-accessorize with toppings. Instead of topping your salad with nuts, avocado and cranberries (all three will clock in at 313 calories), just pick one. The next day, choose a different accessory, which will also keep your salad interesting. You don't wear all your jewelry every day, right? 9. Ditch the white pasta in favor of  spaghetti squash. One cup of cooked  spaghetti squash has about 40 calories, compared with traditional spaghetti, which comes with more than 200. Spaghetti squash is also nutrient-dense. It's a good source of fiber and Vitamins A and C, and it can be eaten just like you would eat pasta-with a great tomato sauce and [place] meatballs or with pesto, tofu and spinach, for example. 10. Dress up your chili, soups and stews with non-fat [place] yogurt instead of sour cream. Just a 'dollop' of sour cream can set you back 115 calories and a whopping 12g of fat-seven of which are of the artery-clogging variety. Added bonus: [place] yogurt is packed with muscle-building protein, calcium and B Vitamins. 11. Mash cauliflower instead of mashed potatoes. One cup of traditional mashed potatoes-in all their creamy goodness-has more than 200 calories, compared to mashed cauliflower, which you can typically eat for less than 100 calories per 1 cup serving. Cauliflower is a great source of the antioxidant indole-3-carbinol (I3C), which may help reduce the risk of some cancers, like breast cancer. 12. Ditch the ice cream sundae in favor of a [place] yogurt parfait. Instead of a cup of ice cream or fro-yo for dessert, try 1 cup of nonfat Greek yogurt topped with fresh berries and a sprinkle of cacao nibs. Both toppings are packed with antioxidants, which can help reduce cellular inflammation and oxidative damage. And the comparison is a no-brainer: One cup of ice cream has about 275 calories; one cup of frozen yogurt has about 230; and a cup of Greek yogurt has just 130, plus twice the protein, so you're less likely to return to the freezer for a second helping. 13. Put olive oil in a spray container instead of using it directly from the bottle. Each tablespoon of olive oil is 120 calories and 15g of fat. Use a mister instead of pouring it straight into the pan or onto a salad. This allows for portion control and will save you more  than 100 calories. 14. When baking, substitute canned pumpkin for butter or oil. Canned pumpkin-not pumpkin pie mix-is loaded with Vitamin A, which is important for skin and eye health, as well as immunity. And the comparisons are pretty crazy:  cup of canned pumpkin has about 40 calories, compared to butter or oil, which has more than 800 calories. Yes, 800 calories. Applesauce and mashed banana can also serve as good substitutions for butter or oil, usually in a 1:1 ratio. 15. Top casseroles with high-fiber cereal instead of breadcrumbs. Breadcrumbs are typically made with white bread, while breakfast cereals contain 5-9g of fiber per serving. Not only will you save more than 150 calories per  cup serving, the swap will also keep you more full and you'll get a metabolism boost from the added fiber. 16. Snack on pistachios instead of macadamia nuts. Believe it or not, you get the same amount of calories from 35 pistachios (100 calories) as you would from only five macadamia nuts. 17. Chow down on kale chips rather than potato chips. This is my favorite 'don't knock it 'till you try it' swap. Kale chips are so easy to make at home, and you can spice them up with a little grated parmesan or chili powder. Plus, they're a mere fraction of the calories of potato chips, but with the same crunch factor we crave so often. 18. Add seltzer and some fruit slices to your cocktail instead of soda or fruit juice. One cup of soda or fruit juice can pack on as much as 140 calories. Instead,  use seltzer and fruit slices. The fruit provides valuable phytochemicals, such as flavonoids and anthocyanins, which help to combat cancer and stave off the aging process.

## 2017-09-17 NOTE — Progress Notes (Signed)
Impression and Recommendations:    1. HTN, goal below 130/80   2. Inactivity   3. Obesity, Class I, BMI 30-34.9   4. Vitamin D insufficiency   5. Generalized headaches      1. HTN-goal below 130/80- -keep checking your BP and bring a log into next OV.  -Dose change: increase losartan by 25mg , decrease hctz to 6.25mg .  -Goal BP: be consistently below 130/80. If not at goal, will reassess further  -check labs.  2. Inactivity- exercise regularly by walking on a track or on a forgiving surface.    3. Obesity, class I BMI 30-34.9- encouraged pt to lose weight. Encouraged daily exercise.  -start using the Lose It app and track your daily food intake.    4. Vitamin D insufficiency- Pt is tolerating her supplements well without complication. Continue as listed below.  -Last OV 30.1, started 5000 IUs daily, recheck.  -drink adequate amounts of water, equal to 1/2 of your body weight in oz of water per day. Increase this amount if exercising or sweating.   -encouraged self care and taking more time for yourself.  -follow up in 4 weeks to 4 months for wt loss and HTN management after dose change.   Education and routine counseling performed. Handouts provided.  Orders Placed This Encounter  Procedures  . Comprehensive metabolic panel  . VITAMIN D 25 Hydroxy (Vit-D Deficiency, Fractures)    Meds ordered this encounter  Medications  . losartan-hydrochlorothiazide (HYZAAR) 100-12.5 MG tablet    Sig: Take 0.5 tablets by mouth daily.    Dispense:  90 tablet    Refill:  3   The patient was counseled, risk factors were discussed, anticipatory guidance given.  Gross side effects, risk and benefits, and alternatives of medications discussed with patient.  Patient is aware that all medications have potential side effects and we are unable to predict every side effect or drug-drug interaction that may occur.  Expresses verbal understanding and consents to current therapy plan and  treatment regimen.  Return for 4wks- 39mo -->  bring in BP log and lose it log.  Please see AVS handed out to patient at the end of our visit for further patient instructions/ counseling done pertaining to today's office visit.    Note: This document was prepared using Dragon voice recognition software and may include unintentional dictation errors.  This document serves as a record of services personally performed by Thomasene Lot, DO. It was created on her behalf by Thelma Barge, a trained medical scribe. The creation of this record is based on the scribe's personal observations and the provider's statements to them.   I have reviewed the above medical documentation for accuracy and completeness and I concur.  Thomasene Lot 09/17/17 5:43 PM   Subjective:    HPI: Robin Barajas is a 42 y.o. female who presents to [facility] at [facility] today for follow up for HTN:  HTN: From last OV on 08-04-17, we started her on losartan-hctz 50mg -12.5mg .  She is feeling joint pain but is unsure if this is related to her meds. 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 She is only drinking 2-3 bottles of water/day. She is not on a regular exercise routine, but she states she stays active throughout the day at her job.  -  Her blood pressure has been controlled at home.   -  Patient reports good compliance with blood pressure medications.  - Denies medication S-E.   - Smoking Status noted.  - She denies new onset of: chest pain, exercise intolerance, shortness of breath, dizziness, visual changes, headache, lower extremity swelling or claudication.    Last 3 blood pressure readings in our office are as follows: BP Readings from Last 3 Encounters:  09/17/17 130/88  09/02/17 124/80  08/04/17 (!) 139/100    Pulse Readings from Last 3 Encounters:  09/17/17 77  09/02/17 99  08/04/17 66    Filed Weights    09/17/17 1005  Weight: 203 lb 12.8 oz (92.4 kg)      Patient Care Team    Relationship Specialty Notifications Start End  Thomasene Lot, DO PCP - General Family Medicine  02/03/17   Candice Camp, MD Consulting Physician Obstetrics and Gynecology  02/03/17      Lab Results  Component Value Date   CREATININE 0.73 02/20/2017   BUN 7 02/20/2017   NA 137 02/20/2017   K 4.4 02/20/2017   CL 102 02/20/2017   CO2 24 02/20/2017     Lab Results  Component Value Date   CHOL 153 02/20/2017   CHOL 167 10/04/2014   CHOL 167 05/11/2013    Lab Results  Component Value Date   HDL 59 02/20/2017   HDL 87 (A) 10/04/2014   HDL 58 05/11/2013    Lab Results  Component Value Date   LDLCALC 75 02/20/2017   LDLCALC 95 10/04/2014   LDLCALC 90 05/11/2013    Lab Results  Component Value Date   TRIG 97 02/20/2017   TRIG 77 10/04/2014   TRIG 95 05/11/2013    Lab Results  Component Value Date   CHOLHDL 2.6 02/20/2017    No results found for: LDLDIRECT ===================================================================   Patient Active Problem List   Diagnosis Date Noted  . HTN, goal below 130/80 09/17/2017    Priority: High  . Obesity, Class I, BMI 30-34.9 02/03/2017    Priority: High  . History of diagnosis of MVP (mitral valve prolapse) 02/03/2017    Priority: Medium  . Heart palpitations- random times, lasts secs 02/03/2017    Priority: Medium  . Vitamin D insufficiency 02/24/2017    Priority: Low  . Family history of depression 02/03/2017    Priority: Low  . personal history of depression 02/03/2017    Priority: Low  . Inactivity 02/03/2017    Priority: Low  . Generalized headaches 09/17/2017  . Elevated blood pressure reading- borderline many yrs 02/03/2017  . History of appendectomy 02/03/2017     Past Medical History:  Diagnosis Date  . Generalized headaches   . Hypertension 2003     Past Surgical History:  Procedure Laterality Date  . APPENDECTOMY   1990  . ELBOW FRACTURE SURGERY  1984     Family History  Problem Relation Age of Onset  . Depression Mother   . Depression Sister   . Alcohol abuse Maternal Grandfather   . Cancer Paternal Grandmother        ovarian  . Diabetes Paternal Grandfather      Social History   Substance and Sexual Activity  Drug Use No  ,  Social History   Substance and Sexual Activity  Alcohol Use No  ,  Social History   Tobacco Use  Smoking Status Never Smoker  Smokeless Tobacco Never Used  ,    Current Outpatient Medications on File Prior to Visit  Medication Sig Dispense Refill  .  Cholecalciferol (VITAMIN D3) 5000 units TABS 5,000 IU OTC vitamin D3 daily. 90 tablet 3   No current facility-administered medications on file prior to visit.      Allergies  Allergen Reactions  . Morphine And Related Shortness Of Breath and Nausea And Vomiting  . Codeine Itching     Review of Systems:   General:  Denies fever, chills Optho/Auditory:   Denies visual changes, blurred vision Respiratory:   Denies SOB, cough, wheeze, DIB  Cardiovascular:   Denies chest pain, palpitations, painful respirations Gastrointestinal:   Denies nausea, vomiting, diarrhea.  Endocrine:     Denies new hot or cold intolerance Musculoskeletal:  Denies joint swelling, gait issues Skin:  Denies rash, suspicious lesions  Neurological:    Denies dizziness, unexplained weakness, numbness  Psychiatric/Behavioral:   Denies mood changes  Objective:    Blood pressure 130/88, pulse 77, height 5' 5.5" (1.664 m), weight 203 lb 12.8 oz (92.4 kg), last menstrual period 09/03/2017, SpO2 100 %.  Body mass index is 33.4 kg/m.  General: Well Developed, well nourished, and in no acute distress.  HEENT: Normocephalic, atraumatic, pupils equal round reactive to light, neck supple, No carotid bruits, no JVD Skin: Warm and dry, cap RF less 2 sec Cardiac: Regular rate and rhythm, S1, S2 WNL's, no murmurs rubs or  gallops Respiratory: ECTA B/L, Not using accessory muscles, speaking in full sentences. NeuroM-Sk: Ambulates w/o assistance, moves ext * 4 w/o difficulty, sensation grossly intact.  Ext: scant edema b/l lower ext Psych: No HI/SI, judgement and insight good, Euthymic mood. Full Affect.

## 2017-09-18 LAB — COMPREHENSIVE METABOLIC PANEL
ALBUMIN: 4.5 g/dL (ref 3.5–5.5)
ALT: 14 IU/L (ref 0–32)
AST: 15 IU/L (ref 0–40)
Albumin/Globulin Ratio: 1.7 (ref 1.2–2.2)
Alkaline Phosphatase: 42 IU/L (ref 39–117)
BUN / CREAT RATIO: 11 (ref 9–23)
BUN: 7 mg/dL (ref 6–24)
Bilirubin Total: 0.5 mg/dL (ref 0.0–1.2)
CALCIUM: 10 mg/dL (ref 8.7–10.2)
CO2: 23 mmol/L (ref 20–29)
CREATININE: 0.65 mg/dL (ref 0.57–1.00)
Chloride: 99 mmol/L (ref 96–106)
GFR, EST AFRICAN AMERICAN: 128 mL/min/{1.73_m2} (ref >59)
GFR, EST NON AFRICAN AMERICAN: 111 mL/min/{1.73_m2} (ref >59)
GLOBULIN, TOTAL: 2.6 g/dL (ref 1.5–4.5)
Glucose: 86 mg/dL (ref 65–99)
Potassium: 4.7 mmol/L (ref 3.5–5.2)
SODIUM: 137 mmol/L (ref 134–144)
Total Protein: 7.1 g/dL (ref 6.0–8.5)

## 2017-09-18 LAB — VITAMIN D 25 HYDROXY (VIT D DEFICIENCY, FRACTURES): Vit D, 25-Hydroxy: 41.6 ng/mL (ref 30.0–100.0)

## 2018-01-15 ENCOUNTER — Ambulatory Visit: Payer: PRIVATE HEALTH INSURANCE | Admitting: Family Medicine

## 2018-01-26 ENCOUNTER — Ambulatory Visit (INDEPENDENT_AMBULATORY_CARE_PROVIDER_SITE_OTHER): Payer: PRIVATE HEALTH INSURANCE | Admitting: Family Medicine

## 2018-01-26 ENCOUNTER — Encounter: Payer: Self-pay | Admitting: Family Medicine

## 2018-01-26 VITALS — BP 124/79 | HR 93 | Ht 66.0 in | Wt 188.2 lb

## 2018-01-26 DIAGNOSIS — Z723 Lack of physical exercise: Secondary | ICD-10-CM | POA: Diagnosis not present

## 2018-01-26 DIAGNOSIS — E669 Obesity, unspecified: Secondary | ICD-10-CM

## 2018-01-26 DIAGNOSIS — I10 Essential (primary) hypertension: Secondary | ICD-10-CM

## 2018-01-26 NOTE — Progress Notes (Signed)
Impression and Recommendations:    1. HTN, goal below 130/80   2. Obesity, Class I, BMI 30-34.9   3. Inactivity    1. Hypertension - Symptoms stable at this time.  Will continue to monitor.  - Last appointment, halved HCTZ dose and increased losartan by 25. - Patient tolerates adjusted dose of BP medication well.  Continue as prescribed.  - Encouraged ongoing weight loss to help manage her blood pressure.  - Other lifestyle changes such as dash diet and engaging in a regular exercise program discussed with patient.  Educational handouts provided.  - Ambulatory BP monitoring encouraged. Keep log and bring in next OV  2. Vitamin D - Continue taking Vitamin D supplement. - No changes made at this time.  3. Lifestyle & Preventative Health Maintenance - Patient down to 188 lbs from 204 in February.    - Keep up the good work!  BMI Counseling Explained to patient what BMI refers to, and what it means medically.    Told patient to think about it as a "medical risk stratification measurement" and how increasing BMI is associated with increasing risk/ or worsening state of various diseases such as hypertension, hyperlipidemia, diabetes, premature OA, depression etc.  American Heart Association guidelines for healthy diet, basically Mediterranean diet, and exercise guidelines of 30 minutes 5 days per week or more discussed in detail.  Health counseling performed.  All questions answered.  Physical Activity & Weight Loss - Advised patient to weigh herself on the scale at the same time every morning, at the same schedule.  - Continue keeping track of intake and working toward physical activity.  - Advised patient to continue working toward exercising to improve health.    - Encouraged patient to begin with moving 10-15 minutes daily and slowly move up to 30 minute daily goal.  Recommended that the patient eventually strive for at least 150 minutes of moderate cardiovascular  activity per week according to guidelines established by the [facility].   - Healthy dietary habits encouraged, including low-carb, and high amounts of lean protein in diet.   - Patient should also consume adequate amounts of water - half of body weight in oz of water per day.  Health counseling performed.  All questions answered.   Education and routine counseling performed. Handouts provided.  4. Follow-Up - Encouraged patient to keep up the weight loss; very impressive!  Keep it up!  - Patient will return for regularly scheduled chronic follow up.  - Last labs, 02/20/2017 - obtain fasting lab work next CPE.   The patient was counseled, risk factors were discussed, anticipatory guidance given.  Gross side effects, risk and benefits, and alternatives of medications discussed with patient.  Patient is aware that all medications have potential side effects and we are unable to predict every side effect or drug-drug interaction that may occur.  Expresses verbal understanding and consents to current therapy plan and treatment regimen.  Return for f/up in Aug for CPE, come fastign for bldwrk at that same appt.  Please see AVS handed out to patient at the end of our visit for further patient instructions/ counseling done pertaining to today's office visit.    Note: This document was prepared using Dragon voice recognition software and may include unintentional dictation errors.    This document serves as a record of services personally performed by Thomasene Lot, DO. It was created on her behalf by Peggye Fothergill, a trained medical scribe. The creation of  this record is based on the scribe's personal observations and the provider's statements to them.   I have reviewed the above medical documentation for accuracy and completeness and I concur.  Thomasene Lot 01/26/18 10:20 AM     Subjective:    HPI: Robin Barajas is a 42 y.o. female who presents to [facility] at  [facility] today for follow up for HTN.    Weight Loss Weighed 204 on 09/17/2017, down to 188 today.  She's lost weight in the past 6 weeks; thinks she's lost 14 lbs.  She tried the [employer] for 30 days.  Notes "I didn't lose the weight from it, maybe 2-3 lbs."  Notes that it helped her be more aware of her intake and cut down on excess food.  She has one cup of coffee in the morning and water during the day, no junk food pretty much; "it's very rare that I eat junk food."  Notes that she's moving more, but not a consistent exercise routine.  Plays soccer in the backyard with her son at least 3 nights per week.  Confirms that she's been taking Vitamin D at home.  HTN:  -  Her blood pressure has been controlled at home.   Has been checking at home periodically, but not very often.  Patient states that it's running at 118/78.  "I don't have the pain that I had before."  She thinks that new dose has been working out much better for her.  - Patient reports good compliance with blood pressure medications  - Denies medication S-E   - Smoking Status noted   - She denies new onset of: chest pain, exercise intolerance, shortness of breath, dizziness, visual changes, headache, lower extremity swelling or claudication.    Last 3 blood pressure readings in our office are as follows: BP Readings from Last 3 Encounters:  01/26/18 124/79  09/17/17 130/88  09/02/17 124/80    Pulse Readings from Last 3 Encounters:  01/26/18 93  09/17/17 77  09/02/17 99    Filed Weights   01/26/18 0941  Weight: 188 lb 3.2 oz (85.4 kg)      Patient Care Team    Relationship Specialty Notifications Start End  Thomasene Lot, DO PCP - General Family Medicine  02/03/17   Candice Camp, MD Consulting Physician Obstetrics and Gynecology  02/03/17      Lab Results  Component Value Date   CREATININE 0.65 09/17/2017   BUN 7 09/17/2017   NA 137 09/17/2017   K 4.7 09/17/2017   CL 99 09/17/2017    CO2 23 09/17/2017    Lab Results  Component Value Date   CHOL 153 02/20/2017   CHOL 167 10/04/2014   CHOL 167 05/11/2013    Lab Results  Component Value Date   HDL 59 02/20/2017   HDL 87 (A) 10/04/2014   HDL 58 05/11/2013    Lab Results  Component Value Date   LDLCALC 75 02/20/2017   LDLCALC 95 10/04/2014   LDLCALC 90 05/11/2013    Lab Results  Component Value Date   TRIG 97 02/20/2017   TRIG 77 10/04/2014   TRIG 95 05/11/2013    Lab Results  Component Value Date   CHOLHDL 2.6 02/20/2017    No results found for: LDLDIRECT ===================================================================   Patient Active Problem List   Diagnosis Date Noted  . HTN, goal below 130/80 09/17/2017    Priority: High  . Obesity, Class I, BMI 30-34.9 02/03/2017  Priority: High  . History of diagnosis of MVP (mitral valve prolapse) 02/03/2017    Priority: Medium  . Heart palpitations- random times, lasts secs 02/03/2017    Priority: Medium  . Vitamin D insufficiency 02/24/2017    Priority: Low  . Family history of depression 02/03/2017    Priority: Low  . personal history of depression 02/03/2017    Priority: Low  . Inactivity 02/03/2017    Priority: Low  . Generalized headaches 09/17/2017  . Elevated blood pressure reading- borderline many yrs 02/03/2017  . History of appendectomy 02/03/2017     Past Medical History:  Diagnosis Date  . Generalized headaches   . Hypertension 2003     Past Surgical History:  Procedure Laterality Date  . APPENDECTOMY  1990  . ELBOW FRACTURE SURGERY  1984     Family History  Problem Relation Age of Onset  . Depression Mother   . Depression Sister   . Alcohol abuse Maternal Grandfather   . Cancer Paternal Grandmother        ovarian  . Diabetes Paternal Grandfather      Social History   Substance and Sexual Activity  Drug Use No  ,  Social History   Substance and Sexual Activity  Alcohol Use No  ,  Social History    Tobacco Use  Smoking Status Never Smoker  Smokeless Tobacco Never Used  ,    Current Outpatient Medications on File Prior to Visit  Medication Sig Dispense Refill  . Cholecalciferol (VITAMIN D3) 5000 units TABS 5,000 IU OTC vitamin D3 daily. 90 tablet 3  . losartan-hydrochlorothiazide (HYZAAR) 100-12.5 MG tablet Take 0.5 tablets by mouth daily. 90 tablet 3   No current facility-administered medications on file prior to visit.      Allergies  Allergen Reactions  . Morphine And Related Shortness Of Breath and Nausea And Vomiting  . Codeine Itching     Review of Systems:   General:  Denies fever, chills Optho/Auditory:   Denies visual changes, blurred vision Respiratory:   Denies SOB, cough, wheeze, DIB  Cardiovascular:   Denies chest pain, palpitations, painful respirations Gastrointestinal:   Denies nausea, vomiting, diarrhea.  Endocrine:     Denies new hot or cold intolerance Musculoskeletal:  Denies joint swelling, gait issues, or new unexplained myalgias/ arthralgias Skin:  Denies rash, suspicious lesions  Neurological:    Denies dizziness, unexplained weakness, numbness  Psychiatric/Behavioral:   Denies mood changes  Objective:    Blood pressure 124/79, pulse 93, height 5\' 6"  (1.676 m), weight 188 lb 3.2 oz (85.4 kg), SpO2 99 %.  Body mass index is 30.38 kg/m.  General: Well Developed, well nourished, and in no acute distress.  HEENT: Normocephalic, atraumatic, pupils equal round reactive to light, neck supple, No carotid bruits, no JVD Skin: Warm and dry, cap RF less 2 sec Cardiac: Regular rate and rhythm, S1, S2 WNL's, no murmurs rubs or gallops Respiratory: ECTA B/L, Not using accessory muscles, speaking in full sentences. NeuroM-Sk: Ambulates w/o assistance, moves ext * 4 w/o difficulty, sensation grossly intact.  Ext: scant edema b/l lower ext Psych: No HI/SI, judgement and insight good, Euthymic mood. Full Affect.

## 2018-01-26 NOTE — Patient Instructions (Signed)
Great job Aram BeechamCynthia!!    16 pound weight loss since I last saw you!  -You be great if this summer, you could put yourself as more of a priority and try to move 10 to 15 minutes 5 days/week or more and slowly work your way up to the 30-minute goal.  This would definitely help you lower your blood pressure, would aid in weight loss and help with stress management ( which we all need) etc.  Your last wellness exam was on 730 of 2018 so if you would like sometime in August to come in for your yearly physical as well as fasting blood work, that would be great.

## 2018-03-12 ENCOUNTER — Encounter: Payer: Self-pay | Admitting: Family Medicine

## 2018-03-12 ENCOUNTER — Other Ambulatory Visit: Payer: PRIVATE HEALTH INSURANCE

## 2018-03-12 ENCOUNTER — Ambulatory Visit (INDEPENDENT_AMBULATORY_CARE_PROVIDER_SITE_OTHER): Payer: PRIVATE HEALTH INSURANCE | Admitting: Family Medicine

## 2018-03-12 ENCOUNTER — Other Ambulatory Visit: Payer: Self-pay

## 2018-03-12 VITALS — BP 117/83 | HR 88 | Ht 66.0 in | Wt 182.0 lb

## 2018-03-12 DIAGNOSIS — E559 Vitamin D deficiency, unspecified: Secondary | ICD-10-CM

## 2018-03-12 DIAGNOSIS — Z1159 Encounter for screening for other viral diseases: Secondary | ICD-10-CM

## 2018-03-12 DIAGNOSIS — Z0000 Encounter for general adult medical examination without abnormal findings: Secondary | ICD-10-CM | POA: Diagnosis not present

## 2018-03-12 DIAGNOSIS — I10 Essential (primary) hypertension: Secondary | ICD-10-CM

## 2018-03-12 DIAGNOSIS — Z719 Counseling, unspecified: Secondary | ICD-10-CM

## 2018-03-12 DIAGNOSIS — Z23 Encounter for immunization: Secondary | ICD-10-CM

## 2018-03-12 DIAGNOSIS — Z114 Encounter for screening for human immunodeficiency virus [HIV]: Secondary | ICD-10-CM | POA: Diagnosis not present

## 2018-03-12 DIAGNOSIS — E669 Obesity, unspecified: Secondary | ICD-10-CM

## 2018-03-12 NOTE — Patient Instructions (Addendum)
-We will add on the hepatitis C and HIV screening test to your recent labs.  We will obtain your medical records on your mammogram and Pap smears from Dr. Gregor Hams office.  Also Lenna Sciara will be giving you a Tdap today to update that.  Keep up the great work on the weight loss you are doing so good!!  As you lose weight please be mindful that your blood pressure will go down so you might want to keep a little more tighter eye on it than usual.  We will see you in 4 months for blood pressure or sooner if you need any support.   Preventive Care for Adults, Female  A healthy lifestyle and preventive care can promote health and wellness. Preventive health guidelines for women include the following key practices.   A routine yearly physical is a good way to check with your health care provider about your health and preventive screening. It is a chance to share any concerns and updates on your health and to receive a thorough exam.   Visit your dentist for a routine exam and preventive care every 6 months. Brush your teeth twice a day and floss once a day. Good oral hygiene prevents tooth decay and gum disease.   The frequency of eye exams is based on your age, health, family medical history, use of contact lenses, and other factors. Follow your health care provider's recommendations for frequency of eye exams.   Eat a healthy diet. Foods like vegetables, fruits, whole grains, low-fat dairy products, and lean protein foods contain the nutrients you need without too many calories. Decrease your intake of foods high in solid fats, added sugars, and salt. Eat the right amount of calories for you.Get information about a proper diet from your health care provider, if necessary.   Regular physical exercise is one of the most important things you can do for your health. Most adults should get at least 150 minutes of moderate-intensity exercise (any activity that increases your heart rate and causes you to  sweat) each week. In addition, most adults need muscle-strengthening exercises on 2 or more days a week.   Maintain a healthy weight. The body mass index (BMI) is a screening tool to identify possible weight problems. It provides an estimate of body fat based on height and weight. Your health care provider can find your BMI, and can help you achieve or maintain a healthy weight.For adults 20 years and older:   - A BMI below 18.5 is considered underweight.   - A BMI of 18.5 to 24.9 is normal.   - A BMI of 25 to 29.9 is considered overweight.   - A BMI of 30 and above is considered obese.   Maintain normal blood lipids and cholesterol levels by exercising and minimizing your intake of trans and saturated fats.  Eat a balanced diet with plenty of fruit and vegetables. Blood tests for lipids and cholesterol should begin at age 77 and be repeated every 5 years minimum.  If your lipid or cholesterol levels are high, you are over 40, or you are at high risk for heart disease, you may need your cholesterol levels checked more frequently.Ongoing high lipid and cholesterol levels should be treated with medicines if diet and exercise are not working.   If you smoke, find out from your health care provider how to quit. If you do not use tobacco, do not start.   Lung cancer screening is recommended for adults aged 56-80 years  who are at high risk for developing lung cancer because of a history of smoking. A yearly low-dose CT scan of the lungs is recommended for people who have at least a 30-pack-year history of smoking and are a current smoker or have quit within the past 15 years. A pack year of smoking is smoking an average of 1 pack of cigarettes a day for 1 year (for example: 1 pack a day for 30 years or 2 packs a day for 15 years). Yearly screening should continue until the smoker has stopped smoking for at least 15 years. Yearly screening should be stopped for people who develop a health problem that  would prevent them from having lung cancer treatment.   If you are pregnant, do not drink alcohol. If you are breastfeeding, be very cautious about drinking alcohol. If you are not pregnant and choose to drink alcohol, do not have more than 1 drink per day. One drink is considered to be 12 ounces (355 mL) of beer, 5 ounces (148 mL) of wine, or 1.5 ounces (44 mL) of liquor.   Avoid use of street drugs. Do not share needles with anyone. Ask for help if you need support or instructions about stopping the use of drugs.   High blood pressure causes heart disease and increases the risk of stroke. Your blood pressure should be checked at least yearly.  Ongoing high blood pressure should be treated with medicines if weight loss and exercise do not work.   If you are 32-42 years old, ask your health care provider if you should take aspirin to prevent strokes.   Diabetes screening involves taking a blood sample to check your fasting blood sugar level. This should be done once every 3 years, after age 76, if you are within normal weight and without risk factors for diabetes. Testing should be considered at a younger age or be carried out more frequently if you are overweight and have at least 1 risk factor for diabetes.   Breast cancer screening is essential preventive care for women. You should practice "breast self-awareness."  This means understanding the normal appearance and feel of your breasts and may include breast self-examination.  Any changes detected, no matter how small, should be reported to a health care provider.  Women in their 2s and 30s should have a clinical breast exam (CBE) by a health care provider as part of a regular health exam every 1 to 3 years.  After age 68, women should have a CBE every year.  Starting at age 83, women should consider having a mammogram (breast X-ray test) every year.  Women who have a family history of breast cancer should talk to their health care provider  about genetic screening.  Women at a high risk of breast cancer should talk to their health care providers about having an MRI and a mammogram every year.   -Breast cancer gene (BRCA)-related cancer risk assessment is recommended for women who have family members with BRCA-related cancers. BRCA-related cancers include breast, ovarian, tubal, and peritoneal cancers. Having family members with these cancers may be associated with an increased risk for harmful changes (mutations) in the breast cancer genes BRCA1 and BRCA2. Results of the assessment will determine the need for genetic counseling and BRCA1 and BRCA2 testing.   The Pap test is a screening test for cervical cancer. A Pap test can show cell changes on the cervix that might become cervical cancer if left untreated. A Pap test is a  procedure in which cells are obtained and examined from the lower end of the uterus (cervix).   - Women should have a Pap test starting at age 41.   - Between ages 8 and 70, Pap tests should be repeated every 2 years.   - Beginning at age 46, you should have a Pap test every 3 years as long as the past 3 Pap tests have been normal.   - Some women have medical problems that increase the chance of getting cervical cancer. Talk to your health care provider about these problems. It is especially important to talk to your health care provider if a new problem develops soon after your last Pap test. In these cases, your health care provider may recommend more frequent screening and Pap tests.   - The above recommendations are the same for women who have or have not gotten the vaccine for human papillomavirus (HPV).   - If you had a hysterectomy for a problem that was not cancer or a condition that could lead to cancer, then you no longer need Pap tests. Even if you no longer need a Pap test, a regular exam is a good idea to make sure no other problems are starting.   - If you are between ages 41 and 64 years, and you  have had normal Pap tests going back 10 years, you no longer need Pap tests. Even if you no longer need a Pap test, a regular exam is a good idea to make sure no other problems are starting.   - If you have had past treatment for cervical cancer or a condition that could lead to cancer, you need Pap tests and screening for cancer for at least 20 years after your treatment.   - If Pap tests have been discontinued, risk factors (such as a new sexual partner) need to be reassessed to determine if screening should be resumed.   - The HPV test is an additional test that may be used for cervical cancer screening. The HPV test looks for the virus that can cause the cell changes on the cervix. The cells collected during the Pap test can be tested for HPV. The HPV test could be used to screen women aged 64 years and older, and should be used in women of any age who have unclear Pap test results. After the age of 64, women should have HPV testing at the same frequency as a Pap test.   Colorectal cancer can be detected and often prevented. Most routine colorectal cancer screening begins at the age of 5 years and continues through age 59 years. However, your health care provider may recommend screening at an earlier age if you have risk factors for colon cancer. On a yearly basis, your health care provider may provide home test kits to check for hidden blood in the stool.  Use of a small camera at the end of a tube, to directly examine the colon (sigmoidoscopy or colonoscopy), can detect the earliest forms of colorectal cancer. Talk to your health care provider about this at age 11, when routine screening begins. Direct exam of the colon should be repeated every 5 -10 years through age 2 years, unless early forms of pre-cancerous polyps or small growths are found.   People who are at an increased risk for hepatitis B should be screened for this virus. You are considered at high risk for hepatitis B if:  -You  were born in a country where hepatitis B  occurs often. Talk with your health care provider about which countries are considered high risk.  - Your parents were born in a high-risk country and you have not received a shot to protect against hepatitis B (hepatitis B vaccine).  - You have HIV or AIDS.  - You use needles to inject street drugs.  - You live with, or have sex with, someone who has Hepatitis B.  - You get hemodialysis treatment.  - You take certain medicines for conditions like cancer, organ transplantation, and autoimmune conditions.   Hepatitis C blood testing is recommended for all people born from [birth year] through 1965 and any individual with known risks for hepatitis C.   Practice safe sex. Use condoms and avoid high-risk sexual practices to reduce the spread of sexually transmitted infections (STIs). STIs include gonorrhea, chlamydia, syphilis, trichomonas, herpes, HPV, and human immunodeficiency virus (HIV). Herpes, HIV, and HPV are viral illnesses that have no cure. They can result in disability, cancer, and death. Sexually active women aged 52 years and younger should be checked for chlamydia. Older women with new or multiple partners should also be tested for chlamydia. Testing for other STIs is recommended if you are sexually active and at increased risk.   Osteoporosis is a disease in which the bones lose minerals and strength with aging. This can result in serious bone fractures or breaks. The risk of osteoporosis can be identified using a bone density scan. Women ages 96 years and over and women at risk for fractures or osteoporosis should discuss screening with their health care providers. Ask your health care provider whether you should take a calcium supplement or vitamin D to There are also several preventive steps women can take to avoid osteoporosis and resulting fractures or to keep osteoporosis from worsening. -->Recommendations include:  Eat a balanced diet high in  fruits, vegetables, calcium, and vitamins.  Get enough calcium. The recommended total intake of is 1,200 mg daily; for best absorption, if taking supplements, divide doses into 250-500 mg doses throughout the day. Of the two types of calcium, calcium carbonate is best absorbed when taken with food but calcium citrate can be taken on an empty stomach.  Get enough vitamin D. [employer] and the [employer] recommend at least 1,000 IU per day for women age 33 and over who are at risk of vitamin D deficiency. Vitamin D deficiency can be caused by inadequate sun exposure (for example, those who live in [place]).  Avoid alcohol and smoking. Heavy alcohol intake (more than 7 drinks per week) increases the risk of falls and hip fracture and women smokers tend to lose bone more rapidly and have lower bone mass than nonsmokers. Stopping smoking is one of the most important changes women can make to improve their health and decrease risk for disease.  Be physically active every day. Weight-bearing exercise (for example, fast walking, hiking, jogging, and weight training) may strengthen bones or slow the rate of bone loss that comes with aging. Balancing and muscle-strengthening exercises can reduce the risk of falling and fracture.  Consider therapeutic medications. Currently, several types of effective drugs are available. Healthcare providers can recommend the type most appropriate for each woman.  Eliminate environmental factors that may contribute to accidents. Falls cause nearly 90% of all osteoporotic fractures, so reducing this risk is an important bone-health strategy. Measures include ample lighting, removing obstructions to walking, using nonskid rugs on floors, and placing mats and/or grab bars in showers.  Be aware of  medication side effects. Some common medicines make bones weaker. These include a type of steroid drug called glucocorticoids used for arthritis and asthma, some  antiseizure drugs, certain sleeping pills, treatments for endometriosis, and some cancer drugs. An overactive thyroid gland or using too much thyroid hormone for an underactive thyroid can also be a problem. If you are taking these medicines, talk to your doctor about what you can do to help protect your bones.reduce the rate of osteoporosis.    Menopause can be associated with physical symptoms and risks. Hormone replacement therapy is available to decrease symptoms and risks. You should talk to your health care provider about whether hormone replacement therapy is right for you.   Use sunscreen. Apply sunscreen liberally and repeatedly throughout the day. You should seek shade when your shadow is shorter than you. Protect yourself by wearing long sleeves, pants, a wide-brimmed hat, and sunglasses year round, whenever you are outdoors.   Once a month, do a whole body skin exam, using a mirror to look at the skin on your back. Tell your health care provider of new moles, moles that have irregular borders, moles that are larger than a pencil eraser, or moles that have changed in shape or color.   -Stay current with required vaccines (immunizations).   Influenza vaccine. All adults should be immunized every year.  Tetanus, diphtheria, and acellular pertussis (Td, Tdap) vaccine. Pregnant women should receive 1 dose of Tdap vaccine during each pregnancy. The dose should be obtained regardless of the length of time since the last dose. Immunization is preferred during the 27th 36th week of gestation. An adult who has not previously received Tdap or who does not know her vaccine status should receive 1 dose of Tdap. This initial dose should be followed by tetanus and diphtheria toxoids (Td) booster doses every 10 years. Adults with an unknown or incomplete history of completing a 3-dose immunization series with Td-containing vaccines should begin or complete a primary immunization series including a Tdap  dose. Adults should receive a Td booster every 10 years.  Varicella vaccine. An adult without evidence of immunity to varicella should receive 2 doses or a second dose if she has previously received 1 dose. Pregnant females who do not have evidence of immunity should receive the first dose after pregnancy. This first dose should be obtained before leaving the health care facility. The second dose should be obtained 4 8 weeks after the first dose.  Human papillomavirus (HPV) vaccine. Females aged 32 26 years who have not received the vaccine previously should obtain the 3-dose series. The vaccine is not recommended for use in pregnant females. However, pregnancy testing is not needed before receiving a dose. If a female is found to be pregnant after receiving a dose, no treatment is needed. In that case, the remaining doses should be delayed until after the pregnancy. Immunization is recommended for any person with an immunocompromised condition through the age of 78 years if she did not get any or all doses earlier. During the 3-dose series, the second dose should be obtained 4 8 weeks after the first dose. The third dose should be obtained 24 weeks after the first dose and 16 weeks after the second dose.  Zoster vaccine. One dose is recommended for adults aged 53 years or older unless certain conditions are present.  Measles, mumps, and rubella (MMR) vaccine. Adults born before [birth year] generally are considered immune to measles and mumps. Adults born in [birth year] or later should have  1 or more doses of MMR vaccine unless there is a contraindication to the vaccine or there is laboratory evidence of immunity to each of the three diseases. A routine second dose of MMR vaccine should be obtained at least 28 days after the first dose for students attending postsecondary schools, health care workers, or international travelers. People who received inactivated measles vaccine or an unknown type of measles vaccine during  1963 1967 should receive 2 doses of MMR vaccine. People who received inactivated mumps vaccine or an unknown type of mumps vaccine before 1979 and are at high risk for mumps infection should consider immunization with 2 doses of MMR vaccine. For females of childbearing age, rubella immunity should be determined. If there is no evidence of immunity, females who are not pregnant should be vaccinated. If there is no evidence of immunity, females who are pregnant should delay immunization until after pregnancy. Unvaccinated health care workers born before [birth year] who lack laboratory evidence of measles, mumps, or rubella immunity or laboratory confirmation of disease should consider measles and mumps immunization with 2 doses of MMR vaccine or rubella immunization with 1 dose of MMR vaccine.  Pneumococcal 13-valent conjugate (PCV13) vaccine. When indicated, a person who is uncertain of her immunization history and has no record of immunization should receive the PCV13 vaccine. An adult aged 57 years or older who has certain medical conditions and has not been previously immunized should receive 1 dose of PCV13 vaccine. This PCV13 should be followed with a dose of pneumococcal polysaccharide (PPSV23) vaccine. The PPSV23 vaccine dose should be obtained at least 8 weeks after the dose of PCV13 vaccine. An adult aged 56 years or older who has certain medical conditions and previously received 1 or more doses of PPSV23 vaccine should receive 1 dose of PCV13. The PCV13 vaccine dose should be obtained 1 or more years after the last PPSV23 vaccine dose.  Pneumococcal polysaccharide (PPSV23) vaccine. When PCV13 is also indicated, PCV13 should be obtained first. All adults aged 71 years and older should be immunized. An adult younger than age 2 years who has certain medical conditions should be immunized. Any person who resides in a nursing home or long-term care facility should be immunized. An adult smoker should be  immunized. People with an immunocompromised condition and certain other conditions should receive both PCV13 and PPSV23 vaccines. People with human immunodeficiency virus (HIV) infection should be immunized as soon as possible after diagnosis. Immunization during chemotherapy or radiation therapy should be avoided. Routine use of PPSV23 vaccine is not recommended for American Indians, [place]atives, or people younger than 65 years unless there are medical conditions that require PPSV23 vaccine. When indicated, people who have unknown immunization and have no record of immunization should receive PPSV23 vaccine. One-time revaccination 5 years after the first dose of PPSV23 is recommended for people aged 20 64 years who have chronic kidney failure, nephrotic syndrome, asplenia, or immunocompromised conditions. People who received 1 2 doses of PPSV23 before age 63 years should receive another dose of PPSV23 vaccine at age 62 years or later if at least 5 years have passed since the previous dose. Doses of PPSV23 are not needed for people immunized with PPSV23 at or after age 51 years.  Meningococcal vaccine. Adults with asplenia or persistent complement component deficiencies should receive 2 doses of quadrivalent meningococcal conjugate (MenACWY-D) vaccine. The doses should be obtained at least 2 months apart. Microbiologists working with certain meningococcal bacteria, [employer] recruits, people at risk during an  outbreak, and people who travel to or live in countries with a high rate of meningitis should be immunized. A first-year college student up through age 77 years who is living in a residence hall should receive a dose if she did not receive a dose on or after her 16th birthday. Adults who have certain high-risk conditions should receive one or more doses of vaccine.  Hepatitis A vaccine. Adults who wish to be protected from this disease, have certain high-risk conditions, work with hepatitis A-infected  animals, work in hepatitis A research labs, or travel to or work in countries with a high rate of hepatitis A should be immunized. Adults who were previously unvaccinated and who anticipate close contact with an international adoptee during the first 60 days after arrival in the [place] from a country with a high rate of hepatitis A should be immunized.  Hepatitis B vaccine.  Adults who wish to be protected from this disease, have certain high-risk conditions, may be exposed to blood or other infectious body fluids, are household contacts or sex partners of hepatitis B positive people, are clients or workers in certain care facilities, or travel to or work in countries with a high rate of hepatitis B should be immunized.  Haemophilus influenzae type b (Hib) vaccine. A previously unvaccinated person with asplenia or sickle cell disease or having a scheduled splenectomy should receive 1 dose of Hib vaccine. Regardless of previous immunization, a recipient of a hematopoietic stem cell transplant should receive a 3-dose series 6 12 months after her successful transplant. Hib vaccine is not recommended for adults with HIV infection.  Preventive Services / Frequency Ages 51 to 39years  Blood pressure check.** / Every 1 to 2 years.  Lipid and cholesterol check.** / Every 5 years beginning at age 8.  Clinical breast exam.** / Every 3 years for women in their 25s and 55s.  BRCA-related cancer risk assessment.** / For women who have family members with a BRCA-related cancer (breast, ovarian, tubal, or peritoneal cancers).  Pap test.** / Every 2 years from ages 25 through 66. Every 3 years starting at age 33 through age 44 or 66 with a history of 3 consecutive normal Pap tests.  HPV screening.** / Every 3 years from ages 85 through ages 33 to 37 with a history of 3 consecutive normal Pap tests.  Hepatitis C blood test.** / For any individual with known risks for hepatitis C.  Skin self-exam. /  Monthly.  Influenza vaccine. / Every year.  Tetanus, diphtheria, and acellular pertussis (Tdap, Td) vaccine.** / Consult your health care provider. Pregnant women should receive 1 dose of Tdap vaccine during each pregnancy. 1 dose of Td every 10 years.  Varicella vaccine.** / Consult your health care provider. Pregnant females who do not have evidence of immunity should receive the first dose after pregnancy.  HPV vaccine. / 3 doses over 6 months, if 79 and younger. The vaccine is not recommended for use in pregnant females. However, pregnancy testing is not needed before receiving a dose.  Measles, mumps, rubella (MMR) vaccine.** / You need at least 1 dose of MMR if you were born in 1957 or later. You may also need a 2nd dose. For females of childbearing age, rubella immunity should be determined. If there is no evidence of immunity, females who are not pregnant should be vaccinated. If there is no evidence of immunity, females who are pregnant should delay immunization until after pregnancy.  Pneumococcal 13-valent conjugate (PCV13) vaccine.** /  Consult your health care provider.  Pneumococcal polysaccharide (PPSV23) vaccine.** / 1 to 2 doses if you smoke cigarettes or if you have certain conditions.  Meningococcal vaccine.** / 1 dose if you are age 74 to 9 years and a Market researcher living in a residence hall, or have one of several medical conditions, you need to get vaccinated against meningococcal disease. You may also need additional booster doses.  Hepatitis A vaccine.** / Consult your health care provider.  Hepatitis B vaccine.** / Consult your health care provider.  Haemophilus influenzae type b (Hib) vaccine.** / Consult your health care provider.  Ages 43 to 64years  Blood pressure check.** / Every 1 to 2 years.  Lipid and cholesterol check.** / Every 5 years beginning at age 63 years.  Lung cancer screening. / Every year if you are aged 53 80 years and have a  30-pack-year history of smoking and currently smoke or have quit within the past 15 years. Yearly screening is stopped once you have quit smoking for at least 15 years or develop a health problem that would prevent you from having lung cancer treatment.  Clinical breast exam.** / Every year after age 73 years.  BRCA-related cancer risk assessment.** / For women who have family members with a BRCA-related cancer (breast, ovarian, tubal, or peritoneal cancers).  Mammogram.** / Every year beginning at age 5 years and continuing for as long as you are in good health. Consult with your health care provider.  Pap test.** / Every 3 years starting at age 27 years through age 61 or 98 years with a history of 3 consecutive normal Pap tests.  HPV screening.** / Every 3 years from ages 57 years through ages 97 to 66 years with a history of 3 consecutive normal Pap tests.  Fecal occult blood test (FOBT) of stool. / Every year beginning at age 31 years and continuing until age 106 years. You may not need to do this test if you get a colonoscopy every 10 years.  Flexible sigmoidoscopy or colonoscopy.** / Every 5 years for a flexible sigmoidoscopy or every 10 years for a colonoscopy beginning at age 61 years and continuing until age 4 years.  Hepatitis C blood test.** / For all people born from [birth year] through 1965 and any individual with known risks for hepatitis C.  Skin self-exam. / Monthly.  Influenza vaccine. / Every year.  Tetanus, diphtheria, and acellular pertussis (Tdap/Td) vaccine.** / Consult your health care provider. Pregnant women should receive 1 dose of Tdap vaccine during each pregnancy. 1 dose of Td every 10 years.  Varicella vaccine.** / Consult your health care provider. Pregnant females who do not have evidence of immunity should receive the first dose after pregnancy.  Zoster vaccine.** / 1 dose for adults aged 40 years or older.  Measles, mumps, rubella (MMR) vaccine.** / You need  at least 1 dose of MMR if you were born in 1957 or later. You may also need a 2nd dose. For females of childbearing age, rubella immunity should be determined. If there is no evidence of immunity, females who are not pregnant should be vaccinated. If there is no evidence of immunity, females who are pregnant should delay immunization until after pregnancy.  Pneumococcal 13-valent conjugate (PCV13) vaccine.** / Consult your health care provider.  Pneumococcal polysaccharide (PPSV23) vaccine.** / 1 to 2 doses if you smoke cigarettes or if you have certain conditions.  Meningococcal vaccine.** / Consult your health care provider.  Hepatitis A vaccine.** /  Consult your health care provider.  Hepatitis B vaccine.** / Consult your health care provider.  Haemophilus influenzae type b (Hib) vaccine.** / Consult your health care provider.  Ages 51 years and over  Blood pressure check.** / Every 1 to 2 years.  Lipid and cholesterol check.** / Every 5 years beginning at age 21 years.  Lung cancer screening. / Every year if you are aged 10 80 years and have a 30-pack-year history of smoking and currently smoke or have quit within the past 15 years. Yearly screening is stopped once you have quit smoking for at least 15 years or develop a health problem that would prevent you from having lung cancer treatment.  Clinical breast exam.** / Every year after age 23 years.  BRCA-related cancer risk assessment.** / For women who have family members with a BRCA-related cancer (breast, ovarian, tubal, or peritoneal cancers).  Mammogram.** / Every year beginning at age 86 years and continuing for as long as you are in good health. Consult with your health care provider.  Pap test.** / Every 3 years starting at age 4 years through age 46 or 67 years with 3 consecutive normal Pap tests. Testing can be stopped between 65 and 70 years with 3 consecutive normal Pap tests and no abnormal Pap or HPV tests in the past  10 years.  HPV screening.** / Every 3 years from ages 70 years through ages 70 or 10 years with a history of 3 consecutive normal Pap tests. Testing can be stopped between 65 and 70 years with 3 consecutive normal Pap tests and no abnormal Pap or HPV tests in the past 10 years.  Fecal occult blood test (FOBT) of stool. / Every year beginning at age 8 years and continuing until age 48 years. You may not need to do this test if you get a colonoscopy every 10 years.  Flexible sigmoidoscopy or colonoscopy.** / Every 5 years for a flexible sigmoidoscopy or every 10 years for a colonoscopy beginning at age 46 years and continuing until age 27 years.  Hepatitis C blood test.** / For all people born from [birth year] through 1965 and any individual with known risks for hepatitis C.  Osteoporosis screening.** / A one-time screening for women ages 73 years and over and women at risk for fractures or osteoporosis.  Skin self-exam. / Monthly.  Influenza vaccine. / Every year.  Tetanus, diphtheria, and acellular pertussis (Tdap/Td) vaccine.** / 1 dose of Td every 10 years.  Varicella vaccine.** / Consult your health care provider.  Zoster vaccine.** / 1 dose for adults aged 37 years or older.  Pneumococcal 13-valent conjugate (PCV13) vaccine.** / Consult your health care provider.  Pneumococcal polysaccharide (PPSV23) vaccine.** / 1 dose for all adults aged 19 years and older.  Meningococcal vaccine.** / Consult your health care provider.  Hepatitis A vaccine.** / Consult your health care provider.  Hepatitis B vaccine.** / Consult your health care provider.  Haemophilus influenzae type b (Hib) vaccine.** / Consult your health care provider. ** Family history and personal history of risk and conditions may change your health care provider's recommendations. Document Released: 09/10/2001 Document Revised: 05/05/2013  [facility] Patient Information 2014 [place], [place].   EXERCISE AND DIET:  We  recommended that you start or continue a regular exercise program for good health. Regular exercise means any activity that makes your heart beat faster and makes you sweat.  We recommend exercising at least 30 minutes per day at least 3 days a week, preferably 5.  We also recommend a diet low in fat and sugar / carbohydrates.  Inactivity, poor dietary choices and obesity can cause diabetes, heart attack, stroke, and kidney damage, among others.     ALCOHOL AND SMOKING:  Women should limit their alcohol intake to no more than 7 drinks/beers/glasses of wine (combined, not each!) per week. Moderation of alcohol intake to this level decreases your risk of breast cancer and liver damage.  ( And of course, no recreational drugs are part of a healthy lifestyle.)  Also, you should not be smoking at all or even being exposed to second hand smoke. Most people know smoking can cause cancer, and various heart and lung diseases, but did you know it also contributes to weakening of your bones?  Aging of your skin?  Yellowing of your teeth and nails?   CALCIUM AND VITAMIN D:  Adequate intake of calcium and Vitamin D are recommended.  The recommendations for exact amounts of these supplements seem to change often, but generally speaking 600 mg of calcium (either carbonate or citrate) and 800 units of Vitamin D per day seems prudent. Certain women may benefit from higher intake of Vitamin D.  If you are among these women, your doctor will have told you during your visit.     PAP SMEARS:  Pap smears, to check for cervical cancer or precancers,  have traditionally been done yearly, although recent scientific advances have shown that most women can have pap smears less often.  However, every woman still should have a physical exam from her gynecologist or primary care physician every year. It will include a breast check, inspection of the vulva and vagina to check for abnormal growths or skin changes, a visual exam of the  cervix, and then an exam to evaluate the size and shape of the uterus and ovaries.  And after 42 years of age, a rectal exam is indicated to check for rectal cancers. We will also provide age appropriate advice regarding health maintenance, like when you should have certain vaccines, screening for sexually transmitted diseases, bone density testing, colonoscopy, mammograms, etc.    MAMMOGRAMS:  All women over 26 years old should have a yearly mammogram. Many facilities now offer a "3D" mammogram, which may cost around $50 extra out of pocket. If possible,  we recommend you accept the option to have the 3D mammogram performed.  It both reduces the number of women who will be called back for extra views which then turn out to be normal, and it is better than the routine mammogram at detecting truly abnormal areas.     COLONOSCOPY:  Colonoscopy to screen for colon cancer is recommended for all women at age 12.  We know, you hate the idea of the prep.  We agree, BUT, having colon cancer and not knowing it is worse!!  Colon cancer so often starts as a polyp that can be seen and removed at colonscopy, which can quite literally save your life!  And if your first colonoscopy is normal and you have no family history of colon cancer, most women don't have to have it again for 10 years.  Once every ten years, you can do something that may end up saving your life, right?  We will be happy to help you get it scheduled when you are ready.  Be sure to check your insurance coverage so you understand how much it will cost.  It may be covered as a preventative service at no cost, but you  should check your particular policy.

## 2018-03-12 NOTE — Progress Notes (Signed)
Impression and Recommendations:    1. Encounter for wellness examination   2. Health education/counseling     1) Anticipatory Guidance: Discussed importance of wearing a seatbelt while driving, not texting while driving; sunscreen when outside along with yearly skin surveillance; eating a well balanced and modest diet; physical activity at least 25 minutes per day or 150 min/ week of moderate to intense activity.  2) Immunizations / Screenings / Labs:  All immunizations and screenings that patient agrees to, are up-to-date per recommendations or will be updated today.  Patient understands the needs for q 45mo dental and yearly vision screens which pt will schedule independently. Obtain CBC, CMP, HgA1c, Lipid panel, TSH and vit D when fasting if not already done recently.   -Advised the patient that she could potentially obtain pap smears every 5 years following the new guidelines. However, she should check with her GYN specialist regarding his recommendation.   3) Weight:   Discussed goal of losing even 5-10% of current body weight which would improve overall feelings of well being and improve objective health data significantly.   Improve nutrient density of diet through increasing intake of fruits and vegetables and decreasing saturated/trans fats, white flour products and refined sugar products.   4) Stress management:  Discussed with the patient regarding exercise and daily mantras to aid with any potential stressful situations that would occur.   Expresses verbal understanding and consents to current therapy plan and treatment regimen.  F-up preventative CPE in 1 year. F/up sooner for chronic care management as discussed and/or prn.  Please see orders placed and AVS handed out to patient at the end of our visit for further patient instructions/ counseling done pertaining to today's office visit.  This document serves as a record of services personally performed by Thomasene Loteborah  Opalski, DO. It was created on her behalf by Chestine SporeSoijett Blue, a trained medical scribe. The creation of this record is based on the scribe's personal observations and the provider's statements to them.   I have reviewed the above medical documentation for accuracy and completeness and I concur.  Thomasene LotDeborah Opalski 03/12/18 5:39 PM    Soijett A Blue 1:43 PM    Subjective:    Chief Complaint  Patient presents with   Annual Exam   CC:   HPI: Emeline DarlingCynthia F Straus is a 42 y.o. female who [facility] health maintenance exam. Dr. Rana SnareLowe is her GYN and she receives her pap smears and he orders her mammograms through their clinic. Her most recent mammogram was in February 2019.  She notes that her grandmother was diagnosed with ovarian cancer in her 3470's, however, she denies a family hx of breast cancer.   She is consuming more water, however, she doesn't enjoy the urinary frequency that occurs with that. Her menstrual cycles are regular however, they are minimizing at this time. Denies CP, SOB, dizziness, nausea, vomiting, diarrhea, skin lesions, family hx of melanoma, constipation, food intolerance, and any other symptoms.   As far as time to herself and stress management, she notes that she prays and walks.    Specialist Follow up:  Dr. Genia Delastor is her dentist and she goes every 6 months. She has an eye doctor and she has an appointment every year with her next appointment being next week.    Health Maintenance Summary Reviewed and updated, unless pt declines services.  Colonoscopy:     (Unnecessary secondary to <  10550 or > 42 years old.) Tobacco History Reviewed:   Y  CT scan for screening lung CA:    Abdominal Ultrasound:     ( Unnecessary secondary to < 4265 or > 42 years old) Alcohol:    No concerns, no excessive use Exercise Habits:   Not meeting AHA guidelines, however, she is walking.   STD concerns:   none Drug Use:   None Birth control  method:   n/a Menses regular:     n/a Lumps or breast concerns:      no Breast Cancer Family History:      No Bone/ DEXA scan:    ( Unnecessary due to < 65 and average risk)    There is no immunization history on file for this patient.  Health Maintenance  Topic Date Due   TETANUS/TDAP  11/04/1994   INFLUENZA VACCINE  02/26/2018   PAP SMEAR  03/12/2018 (Originally 11/03/1996)   HIV Screening  02/03/2029 (Originally 11/04/1990)     Wt Readings from Last 3 Encounters:  03/12/18 182 lb (82.6 kg)  01/26/18 188 lb 3.2 oz (85.4 kg)  09/17/17 203 lb 12.8 oz (92.4 kg)   BP Readings from Last 3 Encounters:  03/12/18 117/83  01/26/18 124/79  09/17/17 130/88   Pulse Readings from Last 3 Encounters:  03/12/18 88  01/26/18 93  09/17/17 77     Past Medical History:  Diagnosis Date   Generalized headaches    Hypertension 2003      Past Surgical History:  Procedure Laterality Date   APPENDECTOMY  1990   ELBOW FRACTURE SURGERY  1984      Family History  Problem Relation Age of Onset   Depression Mother    Depression Sister    Alcohol abuse Maternal Grandfather    Cancer Paternal Grandmother        ovarian   Diabetes Paternal Grandfather       Social History   Substance and Sexual Activity  Drug Use No  ,   Social History   Substance and Sexual Activity  Alcohol Use No  ,   Social History   Tobacco Use  Smoking Status Never Smoker  Smokeless Tobacco Never Used  ,   Social History   Substance and Sexual Activity  Sexual Activity Yes    Current Outpatient Medications on File Prior to Visit  Medication Sig Dispense Refill   Cholecalciferol (VITAMIN D3) 5000 units TABS 5,000 IU OTC vitamin D3 daily. 90 tablet 3   losartan-hydrochlorothiazide (HYZAAR) 100-12.5 MG tablet Take 0.5 tablets by mouth daily. 90 tablet 3   No current facility-administered medications on file prior to visit.     Allergies: Morphine and related and  Codeine  Review of Systems: General:   Denies fever, chills, unexplained weight loss.  Optho/Auditory:   Denies visual changes, blurred vision/LOV Respiratory:   Denies SOB, DOE more than baseline levels.  Cardiovascular:   Denies chest pain, palpitations, new onset peripheral edema  Gastrointestinal:   Denies nausea, vomiting, diarrhea.  Genitourinary: Denies dysuria, freq/ urgency, flank pain or discharge from genitals.  Endocrine:     Denies hot or cold intolerance, polyuria, polydipsia. Musculoskeletal:   Denies unexplained myalgias, joint swelling, unexplained arthralgias, gait problems.  Skin:  Denies rash, suspicious lesions Neurological:     Denies dizziness, unexplained weakness, numbness  Psychiatric/Behavioral:   Denies mood changes, suicidal or homicidal ideations, hallucinations    Objective:    Blood pressure 117/83, pulse 88, height 5\' 6"  (1.676  m), weight 182 lb (82.6 kg), last menstrual period 02/26/2018, SpO2 100 %. Body mass index is 29.38 kg/m. General Appearance:    Alert, cooperative, no distress, appears stated age  Head:    Normocephalic, without obvious abnormality, atraumatic  Eyes:    PERRL, conjunctiva/corneas clear, EOM's intact, fundi    benign, both eyes  Ears:    Normal TM's and external ear canals, both ears  Nose:   Nares normal, septum midline, mucosa normal, no drainage    or sinus tenderness  Throat:   Lips w/o lesion, mucosa moist, and tongue normal; teeth and   gums normal  Neck:   Supple, symmetrical, trachea midline, no adenopathy;    thyroid:  no enlargement/tenderness/nodules; no carotid   bruit or JVD  Back:     Symmetric, no curvature, ROM normal, no CVA tenderness  Lungs:     Clear to auscultation bilaterally, respirations unlabored, no       Wh/ R/ R  Chest Wall:    No tenderness or gross deformity; normal excursion   Heart:    Regular rate and rhythm, S1 and S2 normal, no murmur, rub   or gallop  Breast Exam:    No tenderness,  masses, or nipple abnormality b/l; no d/c  Abdomen:     Soft, non-tender, bowel sounds active all four quadrants, NO   G/R/R, no masses, no organomegaly  Genitalia:    Ext genitalia: without lesion, no rash or discharge, No         tenderness;  Cervix: WNL's w/o discharge or lesion;        Adnexa:  No tenderness or palpable masses   Rectal:    Normal tone, no masses or tenderness;   guaiac negative stool  Extremities:   Extremities normal, atraumatic, no cyanosis or gross edema  Pulses:   2+ and symmetric all extremities  Skin:   Warm, dry, Skin color, texture, turgor normal, no obvious rashes or lesions Psych: No HI/SI, judgement and insight good, Euthymic mood. Full Affect.  Neurologic:   CNII-XII intact, normal strength, sensation and reflexes    Throughout

## 2018-03-13 LAB — LIPID PANEL
Chol/HDL Ratio: 2.5 ratio (ref 0.0–4.4)
Cholesterol, Total: 154 mg/dL (ref 100–199)
HDL: 61 mg/dL (ref >39)
LDL Calculated: 79 mg/dL (ref 0–99)
Triglycerides: 69 mg/dL (ref 0–149)
VLDL Cholesterol Cal: 14 mg/dL (ref 5–40)

## 2018-03-13 LAB — COMPREHENSIVE METABOLIC PANEL
ALT: 9 IU/L (ref 0–32)
AST: 14 IU/L (ref 0–40)
Albumin/Globulin Ratio: 1.8 (ref 1.2–2.2)
Albumin: 4.5 g/dL (ref 3.5–5.5)
Alkaline Phosphatase: 34 IU/L — ABNORMAL LOW (ref 39–117)
BUN/Creatinine Ratio: 11 (ref 9–23)
BUN: 10 mg/dL (ref 6–24)
Bilirubin Total: 0.4 mg/dL (ref 0.0–1.2)
CALCIUM: 9.8 mg/dL (ref 8.7–10.2)
CO2: 23 mmol/L (ref 20–29)
CREATININE: 0.88 mg/dL (ref 0.57–1.00)
Chloride: 101 mmol/L (ref 96–106)
GFR calc Af Amer: 94 mL/min/{1.73_m2} (ref >59)
GFR, EST NON AFRICAN AMERICAN: 81 mL/min/{1.73_m2} (ref >59)
GLOBULIN, TOTAL: 2.5 g/dL (ref 1.5–4.5)
Glucose: 85 mg/dL (ref 65–99)
POTASSIUM: 3.8 mmol/L (ref 3.5–5.2)
SODIUM: 138 mmol/L (ref 134–144)
Total Protein: 7 g/dL (ref 6.0–8.5)

## 2018-03-13 LAB — CBC WITH DIFFERENTIAL/PLATELET
BASOS: 0 %
Basophils Absolute: 0 10*3/uL (ref 0.0–0.2)
EOS (ABSOLUTE): 0 10*3/uL (ref 0.0–0.4)
EOS: 1 %
HEMATOCRIT: 35.6 % (ref 34.0–46.6)
Hemoglobin: 12.2 g/dL (ref 11.1–15.9)
Immature Grans (Abs): 0 10*3/uL (ref 0.0–0.1)
Immature Granulocytes: 0 %
LYMPHS ABS: 1.4 10*3/uL (ref 0.7–3.1)
Lymphs: 31 %
MCH: 29.9 pg (ref 26.6–33.0)
MCHC: 34.3 g/dL (ref 31.5–35.7)
MCV: 87 fL (ref 79–97)
MONOS ABS: 0.4 10*3/uL (ref 0.1–0.9)
Monocytes: 8 %
NEUTROS PCT: 60 %
Neutrophils Absolute: 2.6 10*3/uL (ref 1.4–7.0)
PLATELETS: 272 10*3/uL (ref 150–450)
RBC: 4.08 x10E6/uL (ref 3.77–5.28)
RDW: 13.7 % (ref 12.3–15.4)
WBC: 4.4 10*3/uL (ref 3.4–10.8)

## 2018-03-13 LAB — VITAMIN D 25 HYDROXY (VIT D DEFICIENCY, FRACTURES): Vit D, 25-Hydroxy: 59.9 ng/mL (ref 30.0–100.0)

## 2018-03-13 LAB — T4, FREE: FREE T4: 1.24 ng/dL (ref 0.82–1.77)

## 2018-03-13 LAB — TSH: TSH: 0.788 u[IU]/mL (ref 0.450–4.500)

## 2018-03-13 LAB — HEMOGLOBIN A1C
ESTIMATED AVERAGE GLUCOSE: 108 mg/dL
HEMOGLOBIN A1C: 5.4 % (ref 4.8–5.6)

## 2018-07-13 ENCOUNTER — Encounter: Payer: Self-pay | Admitting: Family Medicine

## 2018-07-13 ENCOUNTER — Ambulatory Visit (INDEPENDENT_AMBULATORY_CARE_PROVIDER_SITE_OTHER): Payer: PRIVATE HEALTH INSURANCE | Admitting: Family Medicine

## 2018-07-13 VITALS — BP 120/76 | HR 82 | Temp 99.0°F | Ht 66.0 in | Wt 168.3 lb

## 2018-07-13 DIAGNOSIS — J029 Acute pharyngitis, unspecified: Secondary | ICD-10-CM | POA: Diagnosis not present

## 2018-07-13 DIAGNOSIS — E663 Overweight: Secondary | ICD-10-CM

## 2018-07-13 DIAGNOSIS — J329 Chronic sinusitis, unspecified: Secondary | ICD-10-CM

## 2018-07-13 DIAGNOSIS — B9789 Other viral agents as the cause of diseases classified elsewhere: Secondary | ICD-10-CM

## 2018-07-13 DIAGNOSIS — I10 Essential (primary) hypertension: Secondary | ICD-10-CM

## 2018-07-13 DIAGNOSIS — J069 Acute upper respiratory infection, unspecified: Secondary | ICD-10-CM | POA: Diagnosis not present

## 2018-07-13 DIAGNOSIS — J310 Chronic rhinitis: Secondary | ICD-10-CM

## 2018-07-13 MED ORDER — PREDNISONE 20 MG PO TABS: ORAL_TABLET | ORAL | 0 refills | Status: DC

## 2018-07-13 NOTE — Patient Instructions (Addendum)
Call into the office if any of the following applies: 1. Your symptoms become worse and last more than a week. 2. Your symptoms do not improve in 10 days. Or 3. You have a fever of 100.5 or more on two separate occasions or one sided facial pain then take the antibiotic as prescribed.

## 2018-07-13 NOTE — Progress Notes (Signed)
Impression and Recommendations:    1. Benign essential HTN   2. Overweight (BMI 25.0-29.9)   3. Viral URI with cough   4. Pharyngitis, unspecified etiology   5. Rhinosinusitis     HTN: -She takes Hyzaar as prescribed without any side effects.  -Patient blood pressure today at 139/92 -Patient notes that her range of blood pressure at home have been from 130/80-90.  -Advised the patient to continue to take her Hyzaar as prescribed.    Weight loss:  -On 03/12/2018, she was 182 and as of today, she is 168 lbs.  -Advised the patient to increase her cardio to at least 150 minutes of moderate intensity weekly per AHA Guidelines.     Viral URI with Cough - Viral vs Allergic vs Bacterial causes for pt's symptoms reveiwed.    - Supportive care and various OTC medications discussed in addition to any prescribed. -Will give the patient an injection of dexamethsone today.  -Patient will be prescribed Tussionex and prednisone taper.   - Call or RTC if new symptoms, or if no improvement or worse over next several days.  - Will consider ABX if sx continue past 10 days and worsening if not already given.  -If the patient's symptoms are persisting, then I will prescribe Amoxil TID x 10 days.    Education and routine counseling performed. Handouts provided.  Meds ordered this encounter  Medications  . predniSONE (DELTASONE) 20 MG tablet    Sig: Take 3 tabs po * 2 days, then 2 tabs for 2 d, then 1 tab 2 d, then 1/2 tab 2 days.    Dispense:  15 tablet    Refill:  0    The patient was counseled, risk factors were discussed, anticipatory guidance given.  Gross side effects, risk and benefits, and alternatives of medications discussed with patient.  Patient is aware that all medications have potential side effects and we are unable to predict every side effect or drug-drug interaction that may occur.  Expresses verbal understanding and consents to current therapy plan and treatment  regimen.  Return if symptoms worsen or fail to improve, for f/up in new year as needed wt loss, otherwise q 4-15mo BP.  Please see AVS handed out to patient at the end of our visit for further patient instructions/ counseling done pertaining to today's office visit.    Note:  This document was prepared using Dragon voice recognition software and may include unintentional dictation errors.  This document serves as a record of services personally performed by Thomasene Lot, DO. It was created on her behalf by Chestine Spore, a trained medical scribe. The creation of this record is based on the scribe's personal observations and the provider's statements to them.   I have reviewed the above medical documentation for accuracy and completeness and I concur.  Thomasene Lot, DO 07/13/2018 12:02 PM       Subjective:    HPI: MECHELE KITTLESON is a 42 y.o. female who presents to [facility] today for follow up of CHRONIC CONDITIONS.    Weight loss:  She has been eating normal, however, she is eating less and not as much junk food. She is not exercising as much. She notes that she would like to lose 20 more lbs.   Viral URI She is also complaining of resolved sore throat onset 3 weeks ago. She took meds and then her symptoms resolved at that time. She was evaluated  [facility] due to her sore throat. She had a negative rapid strep and negative culture. She is pushing lots of fluids and she is resting at this time. Pt reports associated ear pain, HA, nasal drainage, and cough. Pt has tried flonase with mild relief of her symptoms. She denies any other symptoms.    HTN:  -  Her blood pressure has been controlled at home.  Pt has been checking it regularly. 130/80-90 is the range of her blood pressure at home typically.   -Patient reports good compliance with blood pressure medications  - Denies medication S-E   - Smoking Status noted   - She denies new  onset of: chest pain, exercise intolerance, shortness of breath, dizziness, visual changes, headache, lower extremity swelling or claudication.    Last 3 blood pressure readings in our office are as follows: BP Readings from Last 3 Encounters:  07/13/18 120/76  03/12/18 117/83  01/26/18 124/79    Pulse Readings from Last 3 Encounters:  07/13/18 82  03/12/18 88  01/26/18 93    Filed Weights   07/13/18 1042  Weight: 168 lb 4.8 oz (76.3 kg)      Patient Care Team    Relationship Specialty Notifications Start End  Thomasene Lot, DO PCP - General Family Medicine  02/03/17   Candice Camp, MD Consulting Physician Obstetrics and Gynecology  02/03/17      Lab Results  Component Value Date   CREATININE 0.88 03/12/2018   BUN 10 03/12/2018   NA 138 03/12/2018   K 3.8 03/12/2018   CL 101 03/12/2018   CO2 23 03/12/2018    Lab Results  Component Value Date   CHOL 154 03/12/2018   CHOL 153 02/20/2017   CHOL 167 10/04/2014    Lab Results  Component Value Date   HDL 61 03/12/2018   HDL 59 02/20/2017   HDL 87 (A) 10/04/2014    Lab Results  Component Value Date   LDLCALC 79 03/12/2018   LDLCALC 75 02/20/2017   LDLCALC 95 10/04/2014    Lab Results  Component Value Date   TRIG 69 03/12/2018   TRIG 97 02/20/2017   TRIG 77 10/04/2014    Lab Results  Component Value Date   CHOLHDL 2.5 03/12/2018   CHOLHDL 2.6 02/20/2017    No results found for: LDLDIRECT ===================================================================   Patient Active Problem List   Diagnosis Date Noted  . HTN, goal below 130/80 09/17/2017    Priority: High  . Overweight (BMI 25.0-29.9) 02/03/2017    Priority: High  . History of diagnosis of MVP (mitral valve prolapse) 02/03/2017    Priority: Medium  . Heart palpitations- random times, lasts secs 02/03/2017    Priority: Medium  . Vitamin D insufficiency 02/24/2017    Priority: Low  . Family history of depression 02/03/2017     Priority: Low  . personal history of depression 02/03/2017    Priority: Low  . Inactivity 02/03/2017    Priority: Low  . Generalized headaches 09/17/2017  . Elevated blood pressure reading- borderline many yrs 02/03/2017  . History of appendectomy 02/03/2017     Past Medical History:  Diagnosis Date  . Generalized headaches   . Hypertension 2003     Past Surgical History:  Procedure Laterality Date  . APPENDECTOMY  1990  . ELBOW FRACTURE SURGERY  1984     Family History  Problem Relation Age of Onset  . Depression Mother   . Depression Sister   .  Alcohol abuse Maternal Grandfather   . Cancer Paternal Grandmother        ovarian  . Diabetes Paternal Grandfather      Social History   Substance and Sexual Activity  Drug Use No  ,  Social History   Substance and Sexual Activity  Alcohol Use No  ,  Social History   Tobacco Use  Smoking Status Never Smoker  Smokeless Tobacco Never Used  ,    Current Outpatient Medications on File Prior to Visit  Medication Sig Dispense Refill  . Cholecalciferol (VITAMIN D3) 5000 units TABS 5,000 IU OTC vitamin D3 daily. 90 tablet 3  . losartan-hydrochlorothiazide (HYZAAR) 100-12.5 MG tablet Take 0.5 tablets by mouth daily. 90 tablet 3   No current facility-administered medications on file prior to visit.      Allergies  Allergen Reactions  . Morphine And Related Shortness Of Breath and Nausea And Vomiting  . Codeine Itching     Review of Systems:   General:  Denies fever, chills Optho/Auditory:   Denies visual changes, blurred vision Respiratory:   Denies SOB, cough, wheeze, DIB  Cardiovascular:   Denies chest pain, palpitations, painful respirations Gastrointestinal:   Denies nausea, vomiting, diarrhea.  Endocrine:     Denies new hot or cold intolerance Musculoskeletal:  Denies joint swelling, gait issues, or new unexplained myalgias/ arthralgias Skin:  Denies rash, suspicious lesions  Neurological:    Denies  dizziness, unexplained weakness, numbness  Psychiatric/Behavioral:   Denies mood changes  Objective:    Blood pressure 120/76, pulse 82, temperature 99 F (37.2 C), height 5\' 6"  (1.676 m), weight 168 lb 4.8 oz (76.3 kg), last menstrual period 06/29/2018, SpO2 100 %.  Body mass index is 27.16 kg/m.  General: Well Developed, well nourished, and in no acute distress.  HEENT: Normocephalic, atraumatic, pupils equal round reactive to light, neck supple, No carotid bruits, no JVD Skin: Warm and dry, cap RF less 2 sec Cardiac: Regular rate and rhythm, S1, S2 WNL's, no murmurs rubs or gallops Respiratory: ECTA B/L, Not using accessory muscles, speaking in full sentences. NeuroM-Sk: Ambulates w/o assistance, moves ext * 4 w/o difficulty, sensation grossly intact.  Ext: scant edema b/l lower ext Psych: No HI/SI, judgement and insight good, Euthymic mood. Full Affect.

## 2018-10-04 ENCOUNTER — Other Ambulatory Visit: Payer: Self-pay | Admitting: Family Medicine

## 2018-10-12 ENCOUNTER — Telehealth: Payer: Self-pay | Admitting: Family Medicine

## 2018-10-12 NOTE — Telephone Encounter (Signed)
Called pharmacy unable to reach anyone.  Called the patient and left message to call the office back. MPulliam, CMA/RT(R)

## 2018-10-12 NOTE — Telephone Encounter (Signed)
Patient called states [facility] has advised her they are unable to refill Rx :   losartan (COZAAR) 100 MG tablet [[id number]]   Order Details  Dose: 50 mg Route: Oral Frequency: Daily  Dispense Quantity: 45 tablet Refills: 0 Fills remaining: --        Sig: Take 0.5 tablets (50 mg total) by mouth daily.     &    Patient request provider call in alternative to : [id number] [place], [place] - [address] [phone] (Phone) [phone] (Fax)   --- Forwarding request to medical assistant.  --Fausto Skillern

## 2018-10-19 NOTE — Telephone Encounter (Signed)
Spoke to pharmacy - not sure why it was on hold.  Will have medication ready for patient today.  Left message to notify the patient. MPulliam, CMA/RT(R)

## 2019-01-01 ENCOUNTER — Encounter: Payer: Self-pay | Admitting: Family Medicine

## 2019-01-01 ENCOUNTER — Other Ambulatory Visit: Payer: Self-pay | Admitting: Family Medicine

## 2019-01-04 MED ORDER — LOSARTAN POTASSIUM 100 MG PO TABS: 50.0000 mg | ORAL_TABLET | Freq: Every day | ORAL | 0 refills | Status: DC

## 2019-01-04 MED ORDER — HYDROCHLOROTHIAZIDE 12.5 MG PO TABS: 6.2500 mg | ORAL_TABLET | Freq: Every day | ORAL | 0 refills | Status: DC

## 2019-02-10 ENCOUNTER — Telehealth: Payer: Self-pay | Admitting: Family Medicine

## 2019-02-10 NOTE — Telephone Encounter (Signed)
Called patient to set up provider required OV / Telehealth appt---left message for her to call back.  --FYI to medical asst.  --glh

## 2019-02-10 NOTE — Telephone Encounter (Signed)
-----   Message from Jerilee Field, [place] sent at 01/04/2019 11:53 AM EDT ----- Patient is due for follow up, please call the patient to make an appointment.  Thanks. MPulliam, CMA/RT(R)

## 2019-04-19 ENCOUNTER — Other Ambulatory Visit: Payer: Self-pay | Admitting: Family Medicine

## 2019-04-30 ENCOUNTER — Telehealth: Payer: Self-pay | Admitting: Family Medicine

## 2019-04-30 NOTE — Telephone Encounter (Signed)
Patient called while office clsd for lunch left msg that Rx refill denied for OV---called pt back to set up Appt / Telehealth left msg as no answer.  --glh

## 2019-05-03 NOTE — Telephone Encounter (Signed)
Mychart message also sent to patient to schedule follow up appointment.

## 2019-05-05 ENCOUNTER — Encounter (INDEPENDENT_AMBULATORY_CARE_PROVIDER_SITE_OTHER): Payer: PRIVATE HEALTH INSURANCE | Admitting: Family Medicine

## 2019-05-05 ENCOUNTER — Encounter: Payer: Self-pay | Admitting: Family Medicine

## 2019-05-05 ENCOUNTER — Other Ambulatory Visit: Payer: Self-pay

## 2019-05-18 ENCOUNTER — Ambulatory Visit (INDEPENDENT_AMBULATORY_CARE_PROVIDER_SITE_OTHER): Payer: BC Managed Care – PPO | Admitting: Family Medicine

## 2019-05-18 ENCOUNTER — Other Ambulatory Visit: Payer: Self-pay

## 2019-05-18 ENCOUNTER — Encounter: Payer: Self-pay | Admitting: Family Medicine

## 2019-05-18 VITALS — BP 121/71 | HR 88 | Temp 97.8°F | Ht 66.0 in | Wt 165.0 lb

## 2019-05-18 DIAGNOSIS — E663 Overweight: Secondary | ICD-10-CM

## 2019-05-18 DIAGNOSIS — Z8659 Personal history of other mental and behavioral disorders: Secondary | ICD-10-CM

## 2019-05-18 DIAGNOSIS — E559 Vitamin D deficiency, unspecified: Secondary | ICD-10-CM

## 2019-05-18 DIAGNOSIS — I10 Essential (primary) hypertension: Secondary | ICD-10-CM | POA: Diagnosis not present

## 2019-05-18 DIAGNOSIS — Z818 Family history of other mental and behavioral disorders: Secondary | ICD-10-CM

## 2019-05-18 DIAGNOSIS — Z723 Lack of physical exercise: Secondary | ICD-10-CM

## 2019-05-18 MED ORDER — HYDROCHLOROTHIAZIDE 12.5 MG PO TABS: 6.2500 mg | ORAL_TABLET | Freq: Every day | ORAL | 0 refills | Status: DC

## 2019-05-18 MED ORDER — LOSARTAN POTASSIUM 100 MG PO TABS: 50.0000 mg | ORAL_TABLET | Freq: Every day | ORAL | 0 refills | Status: DC

## 2019-05-18 NOTE — Progress Notes (Signed)
Telehealth office visit note for Robin LotDeborah Taelon Bendorf, D.O- [facility]   I connected with current patient today and verified that I am speaking with the correct person using two identifiers.   . Location of the patient: Home . Location of the provider: Office Only the patient (+/- their family members at pt's discretion) and myself were participating in the encounter - This visit type was conducted due to national recommendations for restrictions regarding the COVID-19 Pandemic (e.g. social distancing) in an effort to limit this patient's exposure and mitigate transmission in our community.  This format is felt to be most appropriate for this patient at this time.   - The patient did not have access to video technology or had technical difficulties with video requiring transitioning to audio format only. - No physical exam could be performed with this format, beyond that communicated to us by the patient/ family members as noted.   - Additionally my office staff/ schedulers discussed with the patient that there may be a monetary charge related to this service, depending on their medical insurance.   The patient expressed understanding, and agreed to proceed.       History of Present Illness:  Patient states she is on break from work right now.  - Mood Feels she's "absolutely" doing well emotionally and mentally during COVID.  "I'm probably one of the few that it's actually been a good thing for us.  I know that sounds horrible.  But in the spring, we learned to slow down and really take care of ourselves and our family, and that kind of thing."  States she is active, "not on a routine exercise program, but definitely not sitting on the couch eating."  Notes her weight today was 165 lbs.  - Hypertension BP:  At home, states "been up and down, really; not consistently low."  Says "the past week, I've had a headache almost every day," and wonders if her BP  contributes to it.  She measured her BP today at 143/81 ("the highest that I've seen in a long time"); took it again ten minutes later, and it was 121/71.  First time her pulse was 88, second time it was 95.  States her BP is "usually between 120-130."  - Patient reports good compliance with medication and/or lifestyle modification  - Her denies acute concerns or problems related to treatment plan  - She denies new onset of: chest pain, exercise intolerance, shortness of breath, dizziness, visual changes, headache, lower extremity swelling or claudication.   Last 3 blood pressure readings in our office are as follows: BP Readings from Last 3 Encounters:  05/18/19 121/71  07/13/18 120/76  03/12/18 117/83   Filed Weights   05/18/19 1403  Weight: 165 lb (74.8 kg)     [facility] 07/13/2018 03/12/2018 01/26/2018 09/17/2017 09/02/2017  Decreased Interest 0 0 0 0 -  Down, Depressed, Hopeless 0 0 0 0 0  PHQ - 2 Score 0 0 0 0 0  Altered sleeping 1 0 1 1 1   Tired, decreased energy 1 0 0 1 1  Change in appetite 0 0 0 0 0  Feeling bad or failure about yourself  0 0 0 0 0  Trouble concentrating 1 0 0 0 0  Moving slowly or fidgety/restless 0 0 0 0 0  Suicidal thoughts 0 0 0 0 0  PHQ-9 Score 3 0 1 2 2   Difficult doing work/chores  Somewhat difficult Not difficult at all Not difficult at all Not difficult at all Somewhat difficult      Impression and Recommendations:    1. HTN, goal below 130/80   2. Overweight (BMI 25.0-29.9)   3. Vitamin D insufficiency   4. personal history of depression   5. Family history of depression   6. Inactivity      Hypertension, Goal Below 130/80 - BP at goal at this time. - Reviewed goal BP with patient today.  - Advised patient to take her BP cuff in to the clinic to check calibration PRN. - Reviewed that BP may go up and down based on stress levels, physical activity, etc. - Advised patient to sit and relax for 10-15 minutes before  measuring BP and pulse.  - Continue treatment plan as established.  See med list.  - Ongoing ambulatory blood pressure monitoring encouraged. - Reviewed that more information is needed before adjustments to treatment plan can be made.  - Will continue to monitor.  Personal History of Depression - Mood is stable at this time.  - Reviewed the "spokes of the wheel" of mood and health management.  Stressed the importance of ongoing prudent habits, including regular exercise, appropriate sleep hygiene, healthful dietary habits, and prayer/meditation to relax.  - Will continue to monitor.  BMI Counseling - BMI of 2.50-29.9, Overweight Explained to patient what BMI refers to, and what it means medically.    Told patient to think about it as a "medical risk stratification measurement" and how increasing BMI is associated with increasing risk/ or worsening state of various diseases such as hypertension, hyperlipidemia, diabetes, premature OA, depression etc.  American Heart Association guidelines for healthy diet, basically Mediterranean diet, and exercise guidelines of 30 minutes 5 days per week or more discussed in detail.  Health counseling performed.  All questions answered.  Lifestyle & Preventative Health Maintenance - Advised patient to continue working toward exercising to improve overall mental, physical, and emotional health.    - Encouraged patient to engage in daily physical activity, especially a formal exercise routine.  Recommended that the patient eventually strive for at least 150 minutes of moderate cardiovascular activity per week according to guidelines established by the [facility].   - Healthy dietary habits encouraged, including low-carb, and high amounts of lean protein in diet.   - Patient should also consume adequate amounts of water.  Recommendations - Return in future for CPE and full fasting lab work. - Reviewed need to monitor kidney health and electrolytes while  managed on BP meds. - Extensively discussed necessity of prudent monitoring with patient today.   - As part of my medical decision making, I reviewed the following data within the electronic MEDICAL RECORD NUMBER History obtained from pt /family, CMA notes reviewed and incorporated if applicable, Labs reviewed, Radiograph/ tests reviewed if applicable and OV notes from prior OV's with me, as well as other specialists she/he has seen since seeing me last, were all reviewed and used in my medical decision making process today.    - Additionally, discussion had with patient regarding our treatment plan, and their biases/concerns about that plan were used in my medical decision making today.    - The patient agreed with the plan and demonstrated an understanding of the instructions.   No barriers to understanding were identified.    - Red flag symptoms and signs discussed in detail.  Patient expressed understanding regarding what to do in case of emergency\ urgent symptoms.   -  The patient was advised to call back or seek an in-person evaluation if the symptoms worsen or if the condition fails to improve as anticipated.   Return for CPE and full fasting blood work in near future- less than 90 d.    Meds ordered this encounter  Medications  . losartan (COZAAR) 100 MG tablet    Sig: Take 0.5 tablets (50 mg total) by mouth daily.    Dispense:  45 tablet    Refill:  0  . hydrochlorothiazide (HYDRODIURIL) 12.5 MG tablet    Sig: Take 0.5 tablets (6.25 mg total) by mouth daily.    Dispense:  45 tablet    Refill:  0    Medications Discontinued During This Encounter  Medication Reason  . hydrochlorothiazide (HYDRODIURIL) 12.5 MG tablet Reorder  . losartan (COZAAR) 100 MG tablet Reorder      I provided 17.5 minutes of non face-to-face time during this encounter.  Additional time was spent with charting and coordination of care after the actual visit commenced.   Note:  This note was prepared with  assistance of Dragon voice recognition software. Occasional wrong-word or sound-a-like substitutions may have occurred due to the inherent limitations of voice recognition software.   This document serves as a record of services personally performed by Robin Lot, DO. It was created on her behalf by Peggye Fothergill, a trained medical scribe. The creation of this record is based on the scribe's personal observations and the provider's statements to them.   I have reviewed the above medical documentation for accuracy and completeness and I concur.  Robin Lot, DO 05/23/2019 10:39 AM        Patient Care Team    Relationship Specialty Notifications Start End  Robin Lot, DO PCP - General Family Medicine  02/03/17   Candice Camp, MD Consulting Physician Obstetrics and Gynecology  02/03/17      -Vitals obtained; medications/ allergies reconciled;  personal medical, social, Sx etc.histories were updated by CMA, reviewed by me and are reflected in chart   Patient Active Problem List   Diagnosis Date Noted  . HTN, goal below 130/80 09/17/2017    Priority: High  . Overweight (BMI 25.0-29.9) 02/03/2017    Priority: High  . History of diagnosis of MVP (mitral valve prolapse) 02/03/2017    Priority: Medium  . Heart palpitations- random times, lasts secs 02/03/2017    Priority: Medium  . Vitamin D insufficiency 02/24/2017    Priority: Low  . Family history of depression 02/03/2017    Priority: Low  . personal history of depression 02/03/2017    Priority: Low  . Inactivity 02/03/2017    Priority: Low  . Generalized headaches 09/17/2017  . Elevated blood pressure reading- borderline many yrs 02/03/2017  . History of appendectomy 02/03/2017     Current Meds  Medication Sig  . Cholecalciferol (VITAMIN D3) 5000 units TABS 5,000 IU OTC vitamin D3 daily.  . hydrochlorothiazide (HYDRODIURIL) 12.5 MG tablet Take 0.5 tablets (6.25 mg total) by mouth daily.  Marland Kitchen losartan  (COZAAR) 100 MG tablet Take 0.5 tablets (50 mg total) by mouth daily.  . [DISCONTINUED] hydrochlorothiazide (HYDRODIURIL) 12.5 MG tablet Take 0.5 tablets (6.25 mg total) by mouth daily.  . [DISCONTINUED] losartan (COZAAR) 100 MG tablet Take 0.5 tablets (50 mg total) by mouth daily.     Allergies:  Allergies  Allergen Reactions  . Morphine And Related Shortness Of Breath and Nausea And Vomiting  . Codeine Itching     ROS:  See  above HPI for pertinent positives and negatives   Objective:   Blood pressure 121/71, pulse 88, temperature 97.8 F (36.6 C), height 5\' 6"  (1.676 m), weight 165 lb (74.8 kg).  (if some vitals are omitted, this means that patient was UNABLE to obtain them even though they were asked to get them prior to OV today.  They were asked to call us at their earliest convenience with these once obtained. )  General: A & O * 3; sounds in no acute distress; in usual state of health.  Skin: Pt confirms warm and dry extremities and pink fingertips HEENT: Pt confirms lips non-cyanotic Chest: Patient confirms normal chest excursion and movement Respiratory: speaking in full sentences, no conversational dyspnea; patient confirms no use of accessory muscles Psych: insight appears good, mood- appears full

## 2019-06-18 DIAGNOSIS — Z20828 Contact with and (suspected) exposure to other viral communicable diseases: Secondary | ICD-10-CM | POA: Diagnosis not present

## 2019-06-18 DIAGNOSIS — J3489 Other specified disorders of nose and nasal sinuses: Secondary | ICD-10-CM | POA: Diagnosis not present

## 2019-08-26 ENCOUNTER — Other Ambulatory Visit: Payer: Self-pay | Admitting: Family Medicine

## 2019-08-26 DIAGNOSIS — I10 Essential (primary) hypertension: Secondary | ICD-10-CM

## 2019-09-06 ENCOUNTER — Other Ambulatory Visit: Payer: PRIVATE HEALTH INSURANCE

## 2019-09-06 ENCOUNTER — Encounter: Payer: PRIVATE HEALTH INSURANCE | Admitting: Family Medicine

## 2019-09-21 ENCOUNTER — Other Ambulatory Visit: Payer: Self-pay | Admitting: Family Medicine

## 2019-09-21 MED ORDER — HYDROCHLOROTHIAZIDE 12.5 MG PO TABS: 6.2500 mg | ORAL_TABLET | Freq: Every day | ORAL | 0 refills | Status: DC

## 2019-09-21 NOTE — Telephone Encounter (Signed)
30 day supply of med sent to pharmacy. AS, CMA

## 2019-09-21 NOTE — Telephone Encounter (Signed)
Patient came by office 2/23 to reschedule 09/06/19 CPE & lab that was cancelled due to provider being out of office---Pt's Rx refills were tied to CPE appt & now pt is out of HTCZ and request refill.  ---Review provider schedule no available CPE slots till late March(scheduled 3/24) but pt still needs refill on :   hydrochlorothiazide (HYDRODIURIL) 12.5 MG tablet [[id number]]   Order Details Dose: 6.25 mg Route: Oral Frequency: Daily  Dispense Quantity: 45 tablet Refills: 0       Sig: Take 0.5 tablets (6.25 mg total) by mouth daily.       ---Forwarding message to med asst that a Virtual Appt scheduled w/ Dr. Val Eagle for 3/4 for Rx refills & if approved send refill order to :   Preferred Pharmacies      [id number] [place], [place] - [address] [phone] (Phone) [phone] (Fax   --glh

## 2019-09-22 ENCOUNTER — Other Ambulatory Visit: Payer: Self-pay | Admitting: Family Medicine

## 2019-09-22 DIAGNOSIS — I10 Essential (primary) hypertension: Secondary | ICD-10-CM

## 2019-09-23 NOTE — Telephone Encounter (Signed)
Pt has appt scheduled 10/20/2019

## 2019-09-30 ENCOUNTER — Other Ambulatory Visit: Payer: Self-pay

## 2019-09-30 ENCOUNTER — Telehealth: Payer: Self-pay | Admitting: Family Medicine

## 2019-09-30 ENCOUNTER — Ambulatory Visit (INDEPENDENT_AMBULATORY_CARE_PROVIDER_SITE_OTHER): Payer: BC Managed Care – PPO | Admitting: Family Medicine

## 2019-09-30 VITALS — BP 134/79 | HR 75 | Temp 97.3°F | Wt 169.0 lb

## 2019-09-30 DIAGNOSIS — E559 Vitamin D deficiency, unspecified: Secondary | ICD-10-CM

## 2019-09-30 DIAGNOSIS — F4323 Adjustment disorder with mixed anxiety and depressed mood: Secondary | ICD-10-CM

## 2019-09-30 DIAGNOSIS — I10 Essential (primary) hypertension: Secondary | ICD-10-CM

## 2019-09-30 MED ORDER — LOSARTAN POTASSIUM-HCTZ 100-12.5 MG PO TABS: 1.0000 | ORAL_TABLET | Freq: Every day | ORAL | 1 refills | Status: DC

## 2019-09-30 NOTE — Telephone Encounter (Signed)
Patient called left message,states was told to call in BP to [facility].  BP today is 134/79 pulse rate 75 (taken w/ new meter & Patient unsure of accuracy.  --Forwarding information .  --glh

## 2019-09-30 NOTE — Telephone Encounter (Signed)
Vitals documented in pt's telemedicine visit for today.  Robin Barajas, CMA

## 2019-09-30 NOTE — Progress Notes (Signed)
Telehealth office visit note for Robin Barajas, D.O- at Primary Care at [facility]   I connected with current patient today and verified that I am speaking with the correct person using two identifiers.   . Location of the patient: Home . Location of the provider: Office Only the patient (+/- their family members at pt's discretion) and myself were participating in the encounter - This visit type was conducted due to national recommendations for restrictions regarding the COVID-19 Pandemic (e.g. social distancing) in an effort to limit this patient's exposure and mitigate transmission in our community.  This format is felt to be most appropriate for this patient at this time.   - No physical exam could be performed with this format, beyond that communicated to [place] by the patient/ family members as noted.   - Additionally my office staff/ schedulers discussed with the patient that there may be a monetary charge related to this service, depending on their medical insurance.   The patient expressed understanding, and agreed to proceed.     _________________________________________________________________________________   History of Present Illness: No chief complaint on file.   I, Peggye Fothergill, am serving as Neurosurgeon for [employer].  - Hydration She currently drinks 64 ounces of water per day.  - Stress and Lifestyle Feels her stress has been better this week.  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  Says they've had a few teachers out due to family members being exposed, "but for the most part they're all good."  To try to mitigate her stress, she goes walking.  She typically walks about 10-15 minutes; "I tend to do it every day."  After going for a walk, she does feel better.  - Vitamin D Deficiency Has been taking Vitamin D every day.  Denies symptoms or concerns.  - Joint  Pain Notes "I have a Barajas of joint pain throughout my body."  She isn't sure if this is due to medication or something else.  She feels it is "equally throughout mostly my back and upper legs."  Sometimes she feels it in her arms as well.  "It's mostly if I'm working and sitting down and not moving around."  Notes "when I get up in the morning, that type of thing."   Says "this is why I'm trying to move more, because I feel better."  Notes it's becoming "more of a daily thing instead of here and there."  She feels that her symptoms come and go, and notes "it is every day."  Denies hot, swollen, warm joints.  Denies redness.  It's worse after sitting for long periods and then going to get up.  Denies family history of rheumatological disorders; "not that I'm aware of."   Denies associated rashes.  Says her hands and feet "stay frozen, pretty much," but notes "not really tingling."  The coldness in her hands and feet does not improve whether she exercises or not.  Denies diarrhea, constipation, abdominal pain, bloating.  HPI:  Hypertension:  -  Her blood pressure at home has been running: "around average, most days anyway."  Believes it runs around 124/77 most days.  Says she's had a few headaches, but "when I take the blood pressure to make sure that it's not related, it's not high, so I'm not sure what's causing those."  - Patient reports good compliance with medication  and/or lifestyle modification  - Her denies acute concerns or problems related to treatment plan  - She denies new onset of: chest pain, exercise intolerance, shortness of breath, dizziness, visual changes, headache, lower extremity swelling or claudication.   Last 3 blood pressure readings in our office are as follows: BP Readings from Last 3 Encounters:  09/30/19 134/79  05/18/19 121/71  07/13/18 120/76   Filed Weights   09/30/19 1355  Weight: 169 lb (76.7 kg)       No flowsheet data found.  Depression  screen [facility] 2/9 07/13/2018 03/12/2018 01/26/2018 09/17/2017 09/02/2017  Decreased Interest 0 0 0 0 -  Down, Depressed, Hopeless 0 0 0 0 0  PHQ - 2 Score 0 0 0 0 0  Altered sleeping 1 0 1 1 1   Tired, decreased energy 1 0 0 1 1  Change in appetite 0 0 0 0 0  Feeling bad or failure about yourself  0 0 0 0 0  Trouble concentrating 1 0 0 0 0  Moving slowly or fidgety/restless 0 0 0 0 0  Suicidal thoughts 0 0 0 0 0  PHQ-9 Score 3 0 1 2 2   Difficult doing work/chores Somewhat difficult Not difficult at all Not difficult at all Not difficult at all Somewhat difficult      Impression and Recommendations:    1. Benign essential HTN   2. Vitamin D insufficiency   3. h/o Adjustment disorder with mixed anxiety and depressed mood   4. HTN, goal below 130/80       Osteoarthritis - Reviewed patient's concerns extensively during appointment today. - Education provided and all questions answered.  - Discussed that patient's symptoms are likely due to osteoarthritis. - Encouraged patient to move her body, exercise regularly, and hydrate. - Encouraged patient to walk on flat, forgiving surfaces. - Advised patient to adopt a high anti-inflammatory diet. - Discussed dietary and lifestyle methods to help reduce inflammation.  - Patient knows to return for in-office follow-up for examination if her symptoms worsen or fail to improve with conservative management.  - Will continue to monitor.  Vitamin D Insufficiency - Need for re-check near future. - Continue supplementation as prescribed.  See med list. - Will continue to monitor.  Benign Essential HTN - Blood pressure currently is at goal, stable.  - Reviewed patient's treatment plan during appointment today. - Per patient, having difficulty cutting individual pills in half. - Adjusted prescription today, but did not change dosage. - Discontinued individual losartan and individual HCTZ. - Prescribed combination blood pressure pill.  See med  list.  - Counseled patient on pathophysiology of disease and discussed various treatment options, which always includes dietary and lifestyle modification as first line.   - Lifestyle changes such as dash and heart healthy diets and engaging in a regular exercise program discussed extensively with patient.   - Ambulatory blood pressure monitoring encouraged at least 3 times weekly.  Keep log and bring in every office visit.  Reminded patient that if they ever feel poorly in any way, to check their blood pressure and pulse.  - We will continue to monitor.  History of Adjustment Disorder with mixed Anxiety & Depressed Mood - Stable at this time, well-controlled. - Continue management as established. - Will continue to monitor.  Occasional Headache - To help prevent headaches, advised patient to drink plenty of water, avoid trigger foods, avoid caffeine.  - Patient knows to let us know if her symptoms worsen or become concerning.  -  Will continue to monitor.   Recommendations/  FOLLOW-UP - Last blood work obtained in 8 of 2019. - Need for CPE and full fasting blood work near future.  - As part of my medical decision making, I reviewed the following data within the electronic MEDICAL RECORD NUMBER History obtained from pt /family, CMA notes reviewed and incorporated if applicable, Labs reviewed, Radiograph/ tests reviewed if applicable and OV notes from prior OV's with me, as well as other specialists she/he has seen since seeing me last, were all reviewed and used in my medical decision making process today.    - Additionally, discussion had with patient regarding our treatment plan, and their biases/concerns about that plan were used in my medical decision making today.    - The patient agreed with the plan and demonstrated an understanding of the instructions.   No barriers to understanding were identified.    - Red flag symptoms and signs discussed in detail.  Patient expressed  understanding regarding what to do in case of emergency\ urgent symptoms.   - The patient was advised to call back or seek an in-person evaluation if the symptoms worsen or if the condition fails to improve as anticipated.   Return for Need for CPE and full fasting blood work near future..     Meds ordered this encounter  Medications  . losartan-hydrochlorothiazide (HYZAAR) 100-12.5 MG tablet    Sig: Take 1 tablet by mouth daily.    Dispense:  90 tablet    Refill:  1    Medications Discontinued During This Encounter  Medication Reason  . losartan (COZAAR) 100 MG tablet   . hydrochlorothiazide (HYDRODIURIL) 12.5 MG tablet      Time spent on visit including pre-visit chart review and post-visit care was 18 minutes.  Note:  This note was prepared with assistance of Dragon voice recognition software. Occasional wrong-word or sound-a-like substitutions may have occurred due to the inherent limitations of voice recognition software.   The 21st Century Cures Act was signed into law in 2016 which includes the topic of electronic health records.  This provides immediate access to information in MyChart.  This includes consultation notes, operative notes, office notes, lab results and pathology reports.  If you have any questions about what you read please let us know at your next visit or call us at the office.  We are right here with you.  This document serves as a record of services personally performed by Robin Lot, DO. It was created on her behalf by Peggye Fothergill, a trained medical scribe. The creation of this record is based on the scribe's personal observations and the provider's statements to them.   This case required medical decision making of at least moderate complexity. The above documentation from Peggye Fothergill, medical scribe, has been reviewed by Carlye Grippe,  D.O.    __________________________________________________________________________________     Patient Care Team    Relationship Specialty Notifications Start End  Robin Lot, DO PCP - General Family Medicine  02/03/17   Candice Camp, MD Consulting Physician Obstetrics and Gynecology  02/03/17      -Vitals obtained; medications/ allergies reconciled;  personal medical, social, Sx etc.histories were updated by CMA, reviewed by me and are reflected in chart   Patient Active Problem List   Diagnosis Date Noted  . HTN, goal below 130/80 09/17/2017  . Overweight (BMI 25.0-29.9) 02/03/2017  . History of diagnosis of MVP (mitral valve prolapse) 02/03/2017  . Heart palpitations- random times, lasts  secs 02/03/2017  . Vitamin D insufficiency 02/24/2017  . Family history of depression 02/03/2017  . personal history of depression 02/03/2017  . Inactivity 02/03/2017  . Generalized headaches 09/17/2017  . Elevated blood pressure reading- borderline many yrs 02/03/2017  . History of appendectomy 02/03/2017     No outpatient medications have been marked as taking for the 09/30/19 encounter (Office Visit) with Robin Lot, DO.     Allergies:  Allergies  Allergen Reactions  . Morphine And Related Shortness Of Breath and Nausea And Vomiting  . Codeine Itching     ROS:  See above HPI for pertinent positives and negatives   Objective:   Blood pressure 134/79, pulse 75, temperature (!) 97.3 F (36.3 C), temperature source Temporal, weight 169 lb (76.7 kg), last menstrual period 09/16/2019.  (if some vitals are omitted, this means that patient was UNABLE to obtain them even though they were asked to get them prior to OV today.  They were asked to call us at their earliest convenience with these once obtained. )  General: A & O * 3; sounds in no acute distress; in usual state of health.  Skin: Pt confirms warm and dry extremities and pink fingertips HEENT: Pt confirms lips  non-cyanotic Chest: Patient confirms normal chest excursion and movement Respiratory: speaking in full sentences, no conversational dyspnea; patient confirms no use of accessory muscles Psych: insight appears good, mood- appears full

## 2019-10-20 ENCOUNTER — Other Ambulatory Visit: Payer: Self-pay

## 2019-10-20 ENCOUNTER — Encounter: Payer: Self-pay | Admitting: Family Medicine

## 2019-10-20 ENCOUNTER — Other Ambulatory Visit: Payer: BC Managed Care – PPO

## 2019-10-20 ENCOUNTER — Ambulatory Visit (INDEPENDENT_AMBULATORY_CARE_PROVIDER_SITE_OTHER): Payer: BC Managed Care – PPO | Admitting: Family Medicine

## 2019-10-20 VITALS — BP 131/83 | HR 75 | Temp 98.0°F | Resp 12 | Ht 65.0 in | Wt 163.6 lb

## 2019-10-20 DIAGNOSIS — E669 Obesity, unspecified: Secondary | ICD-10-CM | POA: Diagnosis not present

## 2019-10-20 DIAGNOSIS — Z0000 Encounter for general adult medical examination without abnormal findings: Secondary | ICD-10-CM

## 2019-10-20 DIAGNOSIS — I10 Essential (primary) hypertension: Secondary | ICD-10-CM | POA: Diagnosis not present

## 2019-10-20 DIAGNOSIS — E559 Vitamin D deficiency, unspecified: Secondary | ICD-10-CM

## 2019-10-20 DIAGNOSIS — E663 Overweight: Secondary | ICD-10-CM

## 2019-10-20 DIAGNOSIS — Z719 Counseling, unspecified: Secondary | ICD-10-CM | POA: Diagnosis not present

## 2019-10-20 NOTE — Patient Instructions (Signed)
Preventive Care for Adults, Female  A healthy lifestyle and preventive care can promote health and wellness. Preventive health guidelines for women include the following key practices.   A routine yearly physical is a good way to check with your health care provider about your health and preventive screening. It is a chance to share any concerns and updates on your health and to receive a thorough exam.   Visit your dentist for a routine exam and preventive care every 6 months. Brush your teeth twice a day and floss once a day. Good oral hygiene prevents tooth decay and gum disease.   The frequency of eye exams is based on your age, health, family medical history, use of contact lenses, and other factors. Follow your health care provider's recommendations for frequency of eye exams.   Eat a healthy diet. Foods like vegetables, fruits, whole grains, low-fat dairy products, and lean protein foods contain the nutrients you need without too many calories. Decrease your intake of foods high in solid fats, added sugars, and salt. Eat the right amount of calories for you.Get information about a proper diet from your health care provider, if necessary.   Regular physical exercise is one of the most important things you can do for your health. Most adults should get at least 150 minutes of moderate-intensity exercise (any activity that increases your heart rate and causes you to sweat) each week. In addition, most adults need muscle-strengthening exercises on 2 or more days a week.   Maintain a healthy weight. The body mass index (BMI) is a screening tool to identify possible weight problems. It provides an estimate of body fat based on height and weight. Your health care provider can find your BMI, and can help you achieve or maintain a healthy weight.For adults 20 years and older:   - A BMI below 18.5 is considered underweight.   - A BMI of 18.5 to 24.9 is normal.   - A BMI of 25 to 29.9 is  considered overweight.   - A BMI of 30 and above is considered obese.   Maintain normal blood lipids and cholesterol levels by exercising and minimizing your intake of trans and saturated fats.  Eat a balanced diet with plenty of fruit and vegetables. Blood tests for lipids and cholesterol should begin at age 65 and be repeated every 5 years minimum.  If your lipid or cholesterol levels are high, you are over 40, or you are at high risk for heart disease, you may need your cholesterol levels checked more frequently.Ongoing high lipid and cholesterol levels should be treated with medicines if diet and exercise are not working.   If you smoke, find out from your health care provider how to quit. If you do not use tobacco, do not start.   Lung cancer screening is recommended for adults aged 10-80 years who are at high risk for developing lung cancer because of a history of smoking. A yearly low-dose CT scan of the lungs is recommended for people who have at least a 30-pack-year history of smoking and are a current smoker or have quit within the past 15 years. A pack year of smoking is smoking an average of 1 pack of cigarettes a day for 1 year (for example: 1 pack a day for 30 years or 2 packs a day for 15 years). Yearly screening should continue until the smoker has stopped smoking for at least 15 years. Yearly screening should be stopped for people who develop a  health problem that would prevent them from having lung cancer treatment.   If you are pregnant, do not drink alcohol. If you are breastfeeding, be very cautious about drinking alcohol. If you are not pregnant and choose to drink alcohol, do not have more than 1 drink per day. One drink is considered to be 12 ounces (355 mL) of beer, 5 ounces (148 mL) of wine, or 1.5 ounces (44 mL) of liquor.   Avoid use of street drugs. Do not share needles with anyone. Ask for help if you need support or instructions about stopping the use of  drugs.   High blood pressure causes heart disease and increases the risk of stroke. Your blood pressure should be checked at least yearly.  Ongoing high blood pressure should be treated with medicines if weight loss and exercise do not work.   If you are 25-14 years old, ask your health care provider if you should take aspirin to prevent strokes.   Diabetes screening involves taking a blood sample to check your fasting blood sugar level. This should be done once every 3 years, after age 48, if you are within normal weight and without risk factors for diabetes. Testing should be considered at a younger age or be carried out more frequently if you are overweight and have at least 1 risk factor for diabetes.   Breast cancer screening is essential preventive care for women. You should practice "breast self-awareness."  This means understanding the normal appearance and feel of your breasts and may include breast self-examination.  Any changes detected, no matter how small, should be reported to a health care provider.  Women in their 89s and 30s should have a clinical breast exam (CBE) by a health care provider as part of a regular health exam every 1 to 3 years.  After age 63, women should have a CBE every year.  Starting at age 81, women should consider having a mammogram (breast X-ray test) every year.  Women who have a family history of breast cancer should talk to their health care provider about genetic screening.  Women at a high risk of breast cancer should talk to their health care providers about having an MRI and a mammogram every year.   -Breast cancer gene (BRCA)-related cancer risk assessment is recommended for women who have family members with BRCA-related cancers. BRCA-related cancers include breast, ovarian, tubal, and peritoneal cancers. Having family members with these cancers may be associated with an increased risk for harmful changes (mutations) in the breast cancer genes BRCA1 and  BRCA2. Results of the assessment will determine the need for genetic counseling and BRCA1 and BRCA2 testing.   The Pap test is a screening test for cervical cancer. A Pap test can show cell changes on the cervix that might become cervical cancer if left untreated. A Pap test is a procedure in which cells are obtained and examined from the lower end of the uterus (cervix).   - Women should have a Pap test starting at age 13.   - Between ages 51 and 22, Pap tests should be repeated every 2 years.   - Beginning at age 70, you should have a Pap test every 3 years as long as the past 3 Pap tests have been normal.   - Some women have medical problems that increase the chance of getting cervical cancer. Talk to your health care provider about these problems. It is especially important to talk to your health care provider if a  new problem develops soon after your last Pap test. In these cases, your health care provider may recommend more frequent screening and Pap tests.   - The above recommendations are the same for women who have or have not gotten the vaccine for human papillomavirus (HPV).   - If you had a hysterectomy for a problem that was not cancer or a condition that could lead to cancer, then you no longer need Pap tests. Even if you no longer need a Pap test, a regular exam is a good idea to make sure no other problems are starting.   - If you are between ages 8 and 94 years, and you have had normal Pap tests going back 10 years, you no longer need Pap tests. Even if you no longer need a Pap test, a regular exam is a good idea to make sure no other problems are starting.   - If you have had past treatment for cervical cancer or a condition that could lead to cancer, you need Pap tests and screening for cancer for at least 20 years after your treatment.   - If Pap tests have been discontinued, risk factors (such as a new sexual partner) need to be reassessed to determine if screening should  be resumed.   - The HPV test is an additional test that may be used for cervical cancer screening. The HPV test looks for the virus that can cause the cell changes on the cervix. The cells collected during the Pap test can be tested for HPV. The HPV test could be used to screen women aged 9 years and older, and should be used in women of any age who have unclear Pap test results. After the age of 48, women should have HPV testing at the same frequency as a Pap test.   Colorectal cancer can be detected and often prevented. Most routine colorectal cancer screening begins at the age of 94 years and continues through age 56 years. However, your health care provider may recommend screening at an earlier age if you have risk factors for colon cancer. On a yearly basis, your health care provider may provide home test kits to check for hidden blood in the stool.  Use of a small camera at the end of a tube, to directly examine the colon (sigmoidoscopy or colonoscopy), can detect the earliest forms of colorectal cancer. Talk to your health care provider about this at age 47, when routine screening begins. Direct exam of the colon should be repeated every 5 -10 years through age 65 years, unless early forms of pre-cancerous polyps or small growths are found.   People who are at an increased risk for hepatitis B should be screened for this virus. You are considered at high risk for hepatitis B if:  -You were born in a country where hepatitis B occurs often. Talk with your health care provider about which countries are considered high risk.  - Your parents were born in a high-risk country and you have not received a shot to protect against hepatitis B (hepatitis B vaccine).  - You have HIV or AIDS.  - You use needles to inject street drugs.  - You live with, or have sex with, someone who has Hepatitis B.  - You get hemodialysis treatment.  - You take certain medicines for conditions like cancer, organ  transplantation, and autoimmune conditions.   Hepatitis C blood testing is recommended for all people born from [birth year] through 1965 and any individual  with known risks for hepatitis C.   Practice safe sex. Use condoms and avoid high-risk sexual practices to reduce the spread of sexually transmitted infections (STIs). STIs include gonorrhea, chlamydia, syphilis, trichomonas, herpes, HPV, and human immunodeficiency virus (HIV). Herpes, HIV, and HPV are viral illnesses that have no cure. They can result in disability, cancer, and death. Sexually active women aged 17 years and younger should be checked for chlamydia. Older women with new or multiple partners should also be tested for chlamydia. Testing for other STIs is recommended if you are sexually active and at increased risk.   Osteoporosis is a disease in which the bones lose minerals and strength with aging. This can result in serious bone fractures or breaks. The risk of osteoporosis can be identified using a bone density scan. Women ages 38 years and over and women at risk for fractures or osteoporosis should discuss screening with their health care providers. Ask your health care provider whether you should take a calcium supplement or vitamin D to There are also several preventive steps women can take to avoid osteoporosis and resulting fractures or to keep osteoporosis from worsening. -->Recommendations include:  Eat a balanced diet high in fruits, vegetables, calcium, and vitamins.  Get enough calcium. The recommended total intake of is 1,200 mg daily; for best absorption, if taking supplements, divide doses into 250-500 mg doses throughout the day. Of the two types of calcium, calcium carbonate is best absorbed when taken with food but calcium citrate can be taken on an empty stomach.  Get enough vitamin D. [employer] and the [place] recommend at least 1,000 IU per day for women age 28 and over who are at risk of vitamin D  deficiency. Vitamin D deficiency can be caused by inadequate sun exposure (for example, those who live in [place]).  Avoid alcohol and smoking. Heavy alcohol intake (more than 7 drinks per week) increases the risk of falls and hip fracture and women smokers tend to lose bone more rapidly and have lower bone mass than nonsmokers. Stopping smoking is one of the most important changes women can make to improve their health and decrease risk for disease.  Be physically active every day. Weight-bearing exercise (for example, fast walking, hiking, jogging, and weight training) may strengthen bones or slow the rate of bone loss that comes with aging. Balancing and muscle-strengthening exercises can reduce the risk of falling and fracture.  Consider therapeutic medications. Currently, several types of effective drugs are available. Healthcare providers can recommend the type most appropriate for each woman.  Eliminate environmental factors that may contribute to accidents. Falls cause nearly 90% of all osteoporotic fractures, so reducing this risk is an important bone-health strategy. Measures include ample lighting, removing obstructions to walking, using nonskid rugs on floors, and placing mats and/or grab bars in showers.  Be aware of medication side effects. Some common medicines make bones weaker. These include a type of steroid drug called glucocorticoids used for arthritis and asthma, some antiseizure drugs, certain sleeping pills, treatments for endometriosis, and some cancer drugs. An overactive thyroid gland or using too much thyroid hormone for an underactive thyroid can also be a problem. If you are taking these medicines, talk to your doctor about what you can do to help protect your bones.reduce the rate of osteoporosis.    Menopause can be associated with physical symptoms and risks. Hormone replacement therapy is available to decrease symptoms and risks. You should talk to your  health care provider  about whether hormone replacement therapy is right for you.   Use sunscreen. Apply sunscreen liberally and repeatedly throughout the day. You should seek shade when your shadow is shorter than you. Protect yourself by wearing long sleeves, pants, a wide-brimmed hat, and sunglasses year round, whenever you are outdoors.   Once a month, do a whole body skin exam, using a mirror to look at the skin on your back. Tell your health care provider of new moles, moles that have irregular borders, moles that are larger than a pencil eraser, or moles that have changed in shape or color.   -Stay current with required vaccines (immunizations).   Influenza vaccine. All adults should be immunized every year.  Tetanus, diphtheria, and acellular pertussis (Td, Tdap) vaccine. Pregnant women should receive 1 dose of Tdap vaccine during each pregnancy. The dose should be obtained regardless of the length of time since the last dose. Immunization is preferred during the 27th 36th week of gestation. An adult who has not previously received Tdap or who does not know her vaccine status should receive 1 dose of Tdap. This initial dose should be followed by tetanus and diphtheria toxoids (Td) booster doses every 10 years. Adults with an unknown or incomplete history of completing a 3-dose immunization series with Td-containing vaccines should begin or complete a primary immunization series including a Tdap dose. Adults should receive a Td booster every 10 years.  Varicella vaccine. An adult without evidence of immunity to varicella should receive 2 doses or a second dose if she has previously received 1 dose. Pregnant females who do not have evidence of immunity should receive the first dose after pregnancy. This first dose should be obtained before leaving the health care facility. The second dose should be obtained 4 8 weeks after the first dose.  Human papillomavirus (HPV) vaccine. Females aged 13 26  years who have not received the vaccine previously should obtain the 3-dose series. The vaccine is not recommended for use in pregnant females. However, pregnancy testing is not needed before receiving a dose. If a female is found to be pregnant after receiving a dose, no treatment is needed. In that case, the remaining doses should be delayed until after the pregnancy. Immunization is recommended for any person with an immunocompromised condition through the age of 26 years if she did not get any or all doses earlier. During the 3-dose series, the second dose should be obtained 4 8 weeks after the first dose. The third dose should be obtained 24 weeks after the first dose and 16 weeks after the second dose.  Zoster vaccine. One dose is recommended for adults aged 60 years or older unless certain conditions are present.  Measles, mumps, and rubella (MMR) vaccine. Adults born before 1957 generally are considered immune to measles and mumps. Adults born in 1957 or later should have 1 or more doses of MMR vaccine unless there is a contraindication to the vaccine or there is laboratory evidence of immunity to each of the three diseases. A routine second dose of MMR vaccine should be obtained at least 28 days after the first dose for students attending postsecondary schools, health care workers, or international travelers. People who received inactivated measles vaccine or an unknown type of measles vaccine during 1963 1967 should receive 2 doses of MMR vaccine. People who received inactivated mumps vaccine or an unknown type of mumps vaccine before 1979 and are at high risk for mumps infection should consider immunization with 2 doses of   MMR vaccine. For females of childbearing age, rubella immunity should be determined. If there is no evidence of immunity, females who are not pregnant should be vaccinated. If there is no evidence of immunity, females who are pregnant should delay immunization until after pregnancy.  Unvaccinated health care workers born before 1957 who lack laboratory evidence of measles, mumps, or rubella immunity or laboratory confirmation of disease should consider measles and mumps immunization with 2 doses of MMR vaccine or rubella immunization with 1 dose of MMR vaccine.  Pneumococcal 13-valent conjugate (PCV13) vaccine. When indicated, a person who is uncertain of her immunization history and has no record of immunization should receive the PCV13 vaccine. An adult aged 19 years or older who has certain medical conditions and has not been previously immunized should receive 1 dose of PCV13 vaccine. This PCV13 should be followed with a dose of pneumococcal polysaccharide (PPSV23) vaccine. The PPSV23 vaccine dose should be obtained at least 8 weeks after the dose of PCV13 vaccine. An adult aged 19 years or older who has certain medical conditions and previously received 1 or more doses of PPSV23 vaccine should receive 1 dose of PCV13. The PCV13 vaccine dose should be obtained 1 or more years after the last PPSV23 vaccine dose.  Pneumococcal polysaccharide (PPSV23) vaccine. When PCV13 is also indicated, PCV13 should be obtained first. All adults aged 65 years and older should be immunized. An adult younger than age 65 years who has certain medical conditions should be immunized. Any person who resides in a nursing home or long-term care facility should be immunized. An adult smoker should be immunized. People with an immunocompromised condition and certain other conditions should receive both PCV13 and PPSV23 vaccines. People with human immunodeficiency virus (HIV) infection should be immunized as soon as possible after diagnosis. Immunization during chemotherapy or radiation therapy should be avoided. Routine use of PPSV23 vaccine is not recommended for American Indians, [place] Natives, or people younger than 65 years unless there are medical conditions that require PPSV23 vaccine. When indicated,  people who have unknown immunization and have no record of immunization should receive PPSV23 vaccine. One-time revaccination 5 years after the first dose of PPSV23 is recommended for people aged 19 64 years who have chronic kidney failure, nephrotic syndrome, asplenia, or immunocompromised conditions. People who received 1 2 doses of PPSV23 before age 65 years should receive another dose of PPSV23 vaccine at age 65 years or later if at least 5 years have passed since the previous dose. Doses of PPSV23 are not needed for people immunized with PPSV23 at or after age 65 years.  Meningococcal vaccine. Adults with asplenia or persistent complement component deficiencies should receive 2 doses of quadrivalent meningococcal conjugate (MenACWY-D) vaccine. The doses should be obtained at least 2 months apart. Microbiologists working with certain meningococcal bacteria, military recruits, people at risk during an outbreak, and people who travel to or live in countries with a high rate of meningitis should be immunized. A first-year college student up through age 21 years who is living in a residence hall should receive a dose if she did not receive a dose on or after her 16th birthday. Adults who have certain high-risk conditions should receive one or more doses of vaccine.  Hepatitis A vaccine. Adults who wish to be protected from this disease, have certain high-risk conditions, work with hepatitis A-infected animals, work in hepatitis A research labs, or travel to or work in countries with a high rate of hepatitis A should be   immunized. Adults who were previously unvaccinated and who anticipate close contact with an international adoptee during the first 60 days after arrival in the [place] from a country with a high rate of hepatitis A should be immunized.  Hepatitis B vaccine.  Adults who wish to be protected from this disease, have certain high-risk conditions, may be exposed to blood or other infectious  body fluids, are household contacts or sex partners of hepatitis B positive people, are clients or workers in certain care facilities, or travel to or work in countries with a high rate of hepatitis B should be immunized.  Haemophilus influenzae type b (Hib) vaccine. A previously unvaccinated person with asplenia or sickle cell disease or having a scheduled splenectomy should receive 1 dose of Hib vaccine. Regardless of previous immunization, a recipient of a hematopoietic stem cell transplant should receive a 3-dose series 6 12 months after her successful transplant. Hib vaccine is not recommended for adults with HIV infection.  Preventive Services / Frequency Ages 19 to 39years  Blood pressure check.** / Every 1 to 2 years.  Lipid and cholesterol check.** / Every 5 years beginning at age 20.  Clinical breast exam.** / Every 3 years for women in their 20s and 30s.  BRCA-related cancer risk assessment.** / For women who have family members with a BRCA-related cancer (breast, ovarian, tubal, or peritoneal cancers).  Pap test.** / Every 2 years from ages 21 through 29. Every 3 years starting at age 30 through age 65 or 70 with a history of 3 consecutive normal Pap tests.  HPV screening.** / Every 3 years from ages 30 through ages 65 to 70 with a history of 3 consecutive normal Pap tests.  Hepatitis C blood test.** / For any individual with known risks for hepatitis C.  Skin self-exam. / Monthly.  Influenza vaccine. / Every year.  Tetanus, diphtheria, and acellular pertussis (Tdap, Td) vaccine.** / Consult your health care provider. Pregnant women should receive 1 dose of Tdap vaccine during each pregnancy. 1 dose of Td every 10 years.  Varicella vaccine.** / Consult your health care provider. Pregnant females who do not have evidence of immunity should receive the first dose after pregnancy.  HPV vaccine. / 3 doses over 6 months, if 26 and younger. The vaccine is not recommended for use in  pregnant females. However, pregnancy testing is not needed before receiving a dose.  Measles, mumps, rubella (MMR) vaccine.** / You need at least 1 dose of MMR if you were born in 1957 or later. You may also need a 2nd dose. For females of childbearing age, rubella immunity should be determined. If there is no evidence of immunity, females who are not pregnant should be vaccinated. If there is no evidence of immunity, females who are pregnant should delay immunization until after pregnancy.  Pneumococcal 13-valent conjugate (PCV13) vaccine.** / Consult your health care provider.  Pneumococcal polysaccharide (PPSV23) vaccine.** / 1 to 2 doses if you smoke cigarettes or if you have certain conditions.  Meningococcal vaccine.** / 1 dose if you are age 19 to 21 years and a first-year college student living in a residence hall, or have one of several medical conditions, you need to get vaccinated against meningococcal disease. You may also need additional booster doses.  Hepatitis A vaccine.** / Consult your health care provider.  Hepatitis B vaccine.** / Consult your health care provider.  Haemophilus influenzae type b (Hib) vaccine.** / Consult your health care provider.  Ages 40 to 64years    Blood pressure check.** / Every 1 to 2 years.  Lipid and cholesterol check.** / Every 5 years beginning at age 22 years.  Lung cancer screening. / Every year if you are aged 81 80 years and have a 30-pack-year history of smoking and currently smoke or have quit within the past 15 years. Yearly screening is stopped once you have quit smoking for at least 15 years or develop a health problem that would prevent you from having lung cancer treatment.  Clinical breast exam.** / Every year after age 100 years.  BRCA-related cancer risk assessment.** / For women who have family members with a BRCA-related cancer (breast, ovarian, tubal, or peritoneal cancers).  Mammogram.** / Every year beginning at age 74  years and continuing for as long as you are in good health. Consult with your health care provider.  Pap test.** / Every 3 years starting at age 28 years through age 30 or 38 years with a history of 3 consecutive normal Pap tests.  HPV screening.** / Every 3 years from ages 85 years through ages 91 to 58 years with a history of 3 consecutive normal Pap tests.  Fecal occult blood test (FOBT) of stool. / Every year beginning at age 60 years and continuing until age 55 years. You may not need to do this test if you get a colonoscopy every 10 years.  Flexible sigmoidoscopy or colonoscopy.** / Every 5 years for a flexible sigmoidoscopy or every 10 years for a colonoscopy beginning at age 3 years and continuing until age 56 years.  Hepatitis C blood test.** / For all people born from [birth year] through 1965 and any individual with known risks for hepatitis C.  Skin self-exam. / Monthly.  Influenza vaccine. / Every year.  Tetanus, diphtheria, and acellular pertussis (Tdap/Td) vaccine.** / Consult your health care provider. Pregnant women should receive 1 dose of Tdap vaccine during each pregnancy. 1 dose of Td every 10 years.  Varicella vaccine.** / Consult your health care provider. Pregnant females who do not have evidence of immunity should receive the first dose after pregnancy.  Zoster vaccine.** / 1 dose for adults aged 50 years or older.  Measles, mumps, rubella (MMR) vaccine.** / You need at least 1 dose of MMR if you were born in 1957 or later. You may also need a 2nd dose. For females of childbearing age, rubella immunity should be determined. If there is no evidence of immunity, females who are not pregnant should be vaccinated. If there is no evidence of immunity, females who are pregnant should delay immunization until after pregnancy.  Pneumococcal 13-valent conjugate (PCV13) vaccine.** / Consult your health care provider.  Pneumococcal polysaccharide (PPSV23) vaccine.** / 1 to 2 doses if  you smoke cigarettes or if you have certain conditions.  Meningococcal vaccine.** / Consult your health care provider.  Hepatitis A vaccine.** / Consult your health care provider.  Hepatitis B vaccine.** / Consult your health care provider.  Haemophilus influenzae type b (Hib) vaccine.** / Consult your health care provider.  Ages 47 years and over  Blood pressure check.** / Every 1 to 2 years.  Lipid and cholesterol check.** / Every 5 years beginning at age 55 years.  Lung cancer screening. / Every year if you are aged 57 80 years and have a 30-pack-year history of smoking and currently smoke or have quit within the past 15 years. Yearly screening is stopped once you have quit smoking for at least 15 years or develop a health problem that  would prevent you from having lung cancer treatment.  Clinical breast exam.** / Every year after age 68 years.  BRCA-related cancer risk assessment.** / For women who have family members with a BRCA-related cancer (breast, ovarian, tubal, or peritoneal cancers).  Mammogram.** / Every year beginning at age 47 years and continuing for as long as you are in good health. Consult with your health care provider.  Pap test.** / Every 3 years starting at age 59 years through age 26 or 26 years with 3 consecutive normal Pap tests. Testing can be stopped between 65 and 70 years with 3 consecutive normal Pap tests and no abnormal Pap or HPV tests in the past 10 years.  HPV screening.** / Every 3 years from ages 71 years through ages 19 or 96 years with a history of 3 consecutive normal Pap tests. Testing can be stopped between 65 and 70 years with 3 consecutive normal Pap tests and no abnormal Pap or HPV tests in the past 10 years.  Fecal occult blood test (FOBT) of stool. / Every year beginning at age 59 years and continuing until age 7 years. You may not need to do this test if you get a colonoscopy every 10 years.  Flexible sigmoidoscopy or colonoscopy.** /  Every 5 years for a flexible sigmoidoscopy or every 10 years for a colonoscopy beginning at age 28 years and continuing until age 27 years.  Hepatitis C blood test.** / For all people born from [birth year] through 1965 and any individual with known risks for hepatitis C.  Osteoporosis screening.** / A one-time screening for women ages 6 years and over and women at risk for fractures or osteoporosis.  Skin self-exam. / Monthly.  Influenza vaccine. / Every year.  Tetanus, diphtheria, and acellular pertussis (Tdap/Td) vaccine.** / 1 dose of Td every 10 years.  Varicella vaccine.** / Consult your health care provider.  Zoster vaccine.** / 1 dose for adults aged 20 years or older.  Pneumococcal 13-valent conjugate (PCV13) vaccine.** / Consult your health care provider.  Pneumococcal polysaccharide (PPSV23) vaccine.** / 1 dose for all adults aged 95 years and older.  Meningococcal vaccine.** / Consult your health care provider.  Hepatitis A vaccine.** / Consult your health care provider.  Hepatitis B vaccine.** / Consult your health care provider.  Haemophilus influenzae type b (Hib) vaccine.** / Consult your health care provider. ** Family history and personal history of risk and conditions may change your health care provider's recommendations. Document Released: 09/10/2001 Document Revised: 05/05/2013  [facility] Patient Information 2014 [place], [place].   EXERCISE AND DIET:  We recommended that you start or continue a regular exercise program for good health. Regular exercise means any activity that makes your heart beat faster and makes you sweat.  We recommend exercising at least 30 minutes per day at least 3 days a week, preferably 5.  We also recommend a diet low in fat and sugar / carbohydrates.  Inactivity, poor dietary choices and obesity can cause diabetes, heart attack, stroke, and kidney damage, among others.     ALCOHOL AND SMOKING:  Women should limit their alcohol intake to no  more than 7 drinks/beers/glasses of wine (combined, not each!) per week. Moderation of alcohol intake to this level decreases your risk of breast cancer and liver damage.  ( And of course, no recreational drugs are part of a healthy lifestyle.)  Also, you should not be smoking at all or even being exposed to second hand smoke. Most people know smoking can  cause cancer, and various heart and lung diseases, but did you know it also contributes to weakening of your bones?  Aging of your skin?  Yellowing of your teeth and nails?   CALCIUM AND VITAMIN D:  Adequate intake of calcium and Vitamin D are recommended.  The recommendations for exact amounts of these supplements seem to change often, but generally speaking 600 mg of calcium (either carbonate or citrate) and 800 units of Vitamin D per day seems prudent. Certain women may benefit from higher intake of Vitamin D.  If you are among these women, your doctor will have told you during your visit.     PAP SMEARS:  Pap smears, to check for cervical cancer or precancers,  have traditionally been done yearly, although recent scientific advances have shown that most women can have pap smears less often.  However, every woman still should have a physical exam from her gynecologist or primary care physician every year. It will include a breast check, inspection of the vulva and vagina to check for abnormal growths or skin changes, a visual exam of the cervix, and then an exam to evaluate the size and shape of the uterus and ovaries.  And after 44 years of age, a rectal exam is indicated to check for rectal cancers. We will also provide age appropriate advice regarding health maintenance, like when you should have certain vaccines, screening for sexually transmitted diseases, bone density testing, colonoscopy, mammograms, etc.    MAMMOGRAMS:  All women over 40 years old should have a yearly mammogram. Many facilities now offer a "3D" mammogram, which may cost  around $50 extra out of pocket. If possible,  we recommend you accept the option to have the 3D mammogram performed.  It both reduces the number of women who will be called back for extra views which then turn out to be normal, and it is better than the routine mammogram at detecting truly abnormal areas.     COLONOSCOPY:  Colonoscopy to screen for colon cancer is recommended for all women at age 27.  We know, you hate the idea of the prep.  We agree, BUT, having colon cancer and not knowing it is worse!!  Colon cancer so often starts as a polyp that can be seen and removed at colonscopy, which can quite literally save your life!  And if your first colonoscopy is normal and you have no family history of colon cancer, most women don't have to have it again for 10 years.  Once every ten years, you can do something that may end up saving your life, right?  We will be happy to help you get it scheduled when you are ready.  Be sure to check your insurance coverage so you understand how much it will cost.  It may be covered as a preventative service at no cost, but you should check your particular policy.

## 2019-10-20 NOTE — Progress Notes (Signed)
Female Physical  Impression and Recommendations:    1. Encounter for wellness examination   2. Health education/counseling   3. Overweight (BMI 25.0-29.9)   4. HTN, goal below 130/80      1) Anticipatory Guidance: Discussed skin CA prevention and sunscreen when outside along with skin surveillance; eating a balanced and modest diet; physical activity at least 25 minutes per day or minimum of 150 min/ week moderate to intense activity.  - Encouraged patient to engage in prudent skin screening habits, practicing the A,B,C,D's of skin surveillance.  Advised patient and her spouse to look at each other's backs twice for year and monitor for signs of anything changing.   2) Immunizations / Screenings / Labs:   All immunizations are up-to-date per recommendations or will be updated today if pt allows.    - Patient understands with dental and vision screens they will schedule independently.  - Will obtain CBC, CMP, HgA1c, Lipid panel, TSH and vit D when fasting, if not already done past 12 mo/ recently   - Upcoming mammogram, pap smear, etc., coming up in April with Dr. Rana Snare. - Patient knows to ask to have her records sent to clinic here from OBGYN.  - Per patient, her last mammogram was obtained in 2019. - Advised patient to continue to obtain mammogram yearly.  - TDAP up to date.   3) Weight, Health Counseling, and Preventative Maintenance: BMI meaning discussed with patient.  Discussed goal to improve diet habits to improve overall feelings of well being and objective health data. Improve nutrient density of diet through increasing intake of fruits and vegetables and decreasing saturated fats, white flour products and refined sugars.  - Advised patient to continue working toward exercising and prudent weight maintenance goals to improve overall mental, physical, and emotional health.  - Reviewed the "spokes of the wheel" of wellbeing.  Stressed the importance of ongoing  prudent habits, including regular exercise, appropriate sleep hygiene, healthful dietary habits, and prayer/meditation to relax.  - Encouraged patient to engage in daily physical activity as tolerated, especially a formal exercise routine.  Recommended that the patient eventually strive for at least 150 minutes of moderate cardiovascular activity per week according to guidelines established by the [facility].   - Healthy dietary habits encouraged, including low-carb, and high amounts of lean protein in diet.   - Patient should also consume adequate amounts of water.  - Health counseling performed.  All questions answered.   Return for f/up 6 months for chronic care or sooner if bp drops/ or concerns arise.   Reminded pt important of f-up preventative CPE in 1 year.  Reminded pt again, this is in addition to any chronic care visits.    Gross side effects, risk and benefits, and alternatives of medications discussed with patient.  Patient is aware that all medications have potential side effects and we are unable to predict every side effect or drug-drug interaction that may occur.  Expresses verbal understanding and consents to current therapy plan and treatment regimen.  F-up preventative CPE in 1 year- reminded pt again, this is in addition to any chronic care visits.    Please see orders placed and AVS handed out to patient at the end of our visit for further patient instructions/ counseling done pertaining to today's office visit.   This document serves as a record of services personally performed by Thomasene Lot, DO. It was created on her behalf by Peggye Fothergill, a trained medical scribe. The  creation of this record is based on the scribe's personal observations and the provider's statements to them.    The above documentation from Toni Amend, medical scribe, has been reviewed by Marjory Sneddon, D.O.      Subjective:    I, Toni Amend, am serving as Education administrator  for [employer].   CPE HPI: Robin Barajas is a 44 y.o. female who presents to [facility] today a yearly health maintenance exam.   Health Maintenance Summary  - Reviewed and updated, unless pt declines services.  Last Cologuard or Colonoscopy:  Not indicated until age 60. Family history of Colon CA:  No.  Tobacco History Reviewed:  Y; never smoker.  Alcohol and/or drug use:    No concerns; no use Exercise Habits:  Exercising consistently about 15 minutes per day, sometimes more than this. Dental Home:  Goes every six months. Eye exams:  Follows up with eye doctor yearly. Dermatology home:  Does not follow up with dermatology.  Denies skin concerns.  Female Health:  Follows up with Dr. Corinna Capra of OBGYN. PAP Smear - last known results:   no h/o ABN.  Returns to her OBGYN Dr Corinna Capra in April. STD concerns:   none, monogamous Birth control method:  Husband with vasectomy. Menses regular:  Monitored by OBGYN. Lumps or breast concerns:    None.  Last mammogram obtained in 2019.  Patient goes to [facility] in April. Breast Cancer Family History:  No.  Paternal grandmother had ovarian cancer in her 52's.  Additional concerns beyond health maintenance issues:   Denies hearing or vision concerns.  Started with her weight loss goals in May of 2019.  Notes she had lost 45 lbs last February, gained back 10 during pandemic, and now back to 40 lbs weight loss.  Her daughter graduates in May and she wishes to accomplish her goals before this date.   Immunization History  Administered Date(s) Administered  . Tdap 03/12/2018     Health Maintenance  Topic Date Due  . INFLUENZA VACCINE  Never done  . HIV Screening  02/03/2029 (Originally 11/04/1990)  . PAP SMEAR-Modifier  09/12/2020  . TETANUS/TDAP  03/12/2028     Wt Readings from Last 3 Encounters:  10/20/19 163 lb 9.6 oz (74.2 kg)  09/30/19 169 lb (76.7 kg)  05/18/19 165 lb (74.8 kg)   BP Readings from Last 3  Encounters:  10/20/19 131/83  09/30/19 134/79  05/18/19 121/71   Pulse Readings from Last 3 Encounters:  10/20/19 75  09/30/19 75  05/18/19 88     Past Medical History:  Diagnosis Date  . Generalized headaches   . Hypertension 2003      Past Surgical History:  Procedure Laterality Date  . APPENDECTOMY  1990  . ELBOW FRACTURE SURGERY  1984      Family History  Problem Relation Age of Onset  . Depression Mother   . Depression Sister   . Alcohol abuse Maternal Grandfather   . Cancer Paternal Grandmother        ovarian  . Diabetes Paternal Grandfather       Social History   Substance and Sexual Activity  Drug Use No  ,   Social History   Substance and Sexual Activity  Alcohol Use No  ,   Social History   Tobacco Use  Smoking Status Never Smoker  Smokeless Tobacco Never Used  ,   Social History   Substance and Sexual Activity  Sexual Activity  Yes    Current Outpatient Medications on File Prior to Visit  Medication Sig Dispense Refill  . Cholecalciferol (VITAMIN D3) 5000 units TABS 5,000 IU OTC vitamin D3 daily. 90 tablet 3  . losartan-hydrochlorothiazide (HYZAAR) 100-12.5 MG tablet Take 1 tablet by mouth daily. (Patient taking differently: Take 0.5 tablets by mouth daily. ) 90 tablet 1   No current facility-administered medications on file prior to visit.    Allergies: Morphine and related, Codeine, and Turmeric  Review of Systems: General:   Denies fever, chills, unexplained weight loss.  Optho/Auditory:   Denies visual changes, blurred vision/LOV Respiratory:   Denies SOB, DOE more than baseline levels.   Cardiovascular:   Denies chest pain, palpitations, new onset peripheral edema  Gastrointestinal:   Denies nausea, vomiting, diarrhea.  Genitourinary: Denies dysuria, freq/ urgency, flank pain or discharge from genitals.  Endocrine:     Denies hot or cold intolerance, polyuria, polydipsia. Musculoskeletal:   Denies unexplained myalgias,  joint swelling, unexplained arthralgias, gait problems.  Skin:  Denies rash, suspicious lesions Neurological:     Denies dizziness, unexplained weakness, numbness  Psychiatric/Behavioral:   Denies mood changes, suicidal or homicidal ideations, hallucinations    Objective:    Blood pressure 131/83, pulse 75, temperature 98 F (36.7 C), temperature source Oral, resp. rate 12, height 5\' 5"  (1.651 m), weight 163 lb 9.6 oz (74.2 kg), SpO2 100 %. Body mass index is 27.22 kg/m. General Appearance:    Alert, cooperative, no distress, appears stated age  Head:    Normocephalic, without obvious abnormality, atraumatic  Eyes:    PERRL, conjunctiva/corneas clear, EOM's intact, fundi    benign, both eyes  Ears:    Normal TM's and external ear canals, both ears  Nose:   Nares normal, septum midline, mucosa normal, no drainage    or sinus tenderness  Throat:   Lips w/o lesion, mucosa moist, and tongue normal; teeth and   gums normal  Neck:   Supple, symmetrical, trachea midline, no adenopathy;    thyroid:  no enlargement/tenderness/nodules; no carotid   bruit or JVD  Back:     Symmetric, no curvature, ROM normal, no CVA tenderness  Lungs:     Clear to auscultation bilaterally, respirations unlabored, no       Wh/ R/ R  Chest Wall:    No tenderness or gross deformity; normal excursion   Heart:    Regular rate and rhythm, S1 and S2 normal, no murmur, rub   or gallop  Breast Exam:     Deferred to OBGYN.  Abdomen:     Soft, non-tender, bowel sounds active all four quadrants, NO   G/R/R, no masses, no organomegaly  Genitalia:    Deferred to OBGYN.  Rectal:    Deferred.  Extremities:   Extremities normal, atraumatic, no cyanosis or gross edema  Pulses:   2+ and symmetric all extremities  Skin:   Warm, dry, Skin color, texture, turgor normal, no obvious rashes or lesions Psych: No HI/SI, judgement and insight good, Euthymic mood. Full Affect.  Neurologic:   CNII-XII intact, normal strength, sensation  and reflexes    Throughout

## 2019-10-21 LAB — COMPREHENSIVE METABOLIC PANEL
ALT: 13 IU/L (ref 0–32)
AST: 16 IU/L (ref 0–40)
Albumin/Globulin Ratio: 1.9 (ref 1.2–2.2)
Albumin: 4.5 g/dL (ref 3.8–4.8)
Alkaline Phosphatase: 29 IU/L — ABNORMAL LOW (ref 39–117)
BUN/Creatinine Ratio: 15 (ref 9–23)
BUN: 10 mg/dL (ref 6–24)
Bilirubin Total: 0.5 mg/dL (ref 0.0–1.2)
CO2: 23 mmol/L (ref 20–29)
Calcium: 9.3 mg/dL (ref 8.7–10.2)
Chloride: 99 mmol/L (ref 96–106)
Creatinine, Ser: 0.68 mg/dL (ref 0.57–1.00)
GFR calc Af Amer: 124 mL/min/{1.73_m2} (ref >59)
GFR calc non Af Amer: 107 mL/min/{1.73_m2} (ref >59)
Globulin, Total: 2.4 g/dL (ref 1.5–4.5)
Glucose: 82 mg/dL (ref 65–99)
Potassium: 4.4 mmol/L (ref 3.5–5.2)
Sodium: 134 mmol/L (ref 134–144)
Total Protein: 6.9 g/dL (ref 6.0–8.5)

## 2019-10-21 LAB — LIPID PANEL
Chol/HDL Ratio: 2.1 ratio (ref 0.0–4.4)
Cholesterol, Total: 127 mg/dL (ref 100–199)
HDL: 60 mg/dL (ref >39)
LDL Chol Calc (NIH): 54 mg/dL (ref 0–99)
Triglycerides: 59 mg/dL (ref 0–149)
VLDL Cholesterol Cal: 13 mg/dL (ref 5–40)

## 2019-10-21 LAB — HEMOGLOBIN A1C
Est. average glucose Bld gHb Est-mCnc: 103 mg/dL
Hgb A1c MFr Bld: 5.2 % (ref 4.8–5.6)

## 2019-10-21 LAB — CBC
Hematocrit: 37.8 % (ref 34.0–46.6)
Hemoglobin: 12.4 g/dL (ref 11.1–15.9)
MCH: 27.6 pg (ref 26.6–33.0)
MCHC: 32.8 g/dL (ref 31.5–35.7)
MCV: 84 fL (ref 79–97)
Platelets: 276 10*3/uL (ref 150–450)
RBC: 4.49 x10E6/uL (ref 3.77–5.28)
RDW: 16.8 % — ABNORMAL HIGH (ref 11.7–15.4)
WBC: 4.8 10*3/uL (ref 3.4–10.8)

## 2019-10-21 LAB — TSH: TSH: 0.984 u[IU]/mL (ref 0.450–4.500)

## 2019-10-21 LAB — VITAMIN D 25 HYDROXY (VIT D DEFICIENCY, FRACTURES): Vit D, 25-Hydroxy: 55.5 ng/mL (ref 30.0–100.0)

## 2019-10-21 LAB — T4, FREE: Free T4: 1.32 ng/dL (ref 0.82–1.77)

## 2019-11-15 DIAGNOSIS — Z6826 Body mass index (BMI) 26.0-26.9, adult: Secondary | ICD-10-CM | POA: Diagnosis not present

## 2019-11-15 DIAGNOSIS — Z1231 Encounter for screening mammogram for malignant neoplasm of breast: Secondary | ICD-10-CM | POA: Diagnosis not present

## 2019-11-15 DIAGNOSIS — Z01419 Encounter for gynecological examination (general) (routine) without abnormal findings: Secondary | ICD-10-CM | POA: Diagnosis not present

## 2019-11-15 LAB — HM MAMMOGRAPHY

## 2020-03-21 ENCOUNTER — Encounter (HOSPITAL_COMMUNITY): Payer: Self-pay | Admitting: *Deleted

## 2020-03-21 ENCOUNTER — Other Ambulatory Visit: Payer: Self-pay

## 2020-03-21 ENCOUNTER — Emergency Department (HOSPITAL_COMMUNITY): Payer: BC Managed Care – PPO

## 2020-03-21 ENCOUNTER — Emergency Department (HOSPITAL_COMMUNITY)
Admission: EM | Admit: 2020-03-21 | Discharge: 2020-03-22 | Disposition: A | Discharge status: Home / Self Care | Payer: BC Managed Care – PPO | Attending: Emergency Medicine | Admitting: Emergency Medicine

## 2020-03-21 DIAGNOSIS — R0789 Other chest pain: Secondary | ICD-10-CM | POA: Diagnosis not present

## 2020-03-21 DIAGNOSIS — I10 Essential (primary) hypertension: Secondary | ICD-10-CM | POA: Diagnosis not present

## 2020-03-21 DIAGNOSIS — R110 Nausea: Secondary | ICD-10-CM | POA: Diagnosis not present

## 2020-03-21 DIAGNOSIS — M25512 Pain in left shoulder: Secondary | ICD-10-CM | POA: Diagnosis not present

## 2020-03-21 DIAGNOSIS — Z79899 Other long term (current) drug therapy: Secondary | ICD-10-CM | POA: Diagnosis not present

## 2020-03-21 DIAGNOSIS — R002 Palpitations: Secondary | ICD-10-CM | POA: Diagnosis not present

## 2020-03-21 DIAGNOSIS — R079 Chest pain, unspecified: Secondary | ICD-10-CM | POA: Diagnosis not present

## 2020-03-21 LAB — CBC
HCT: 34 % — ABNORMAL LOW (ref 36.0–46.0)
Hemoglobin: 11.1 g/dL — ABNORMAL LOW (ref 12.0–15.0)
MCH: 29.8 pg (ref 26.0–34.0)
MCHC: 32.6 g/dL (ref 30.0–36.0)
MCV: 91.2 fL (ref 80.0–100.0)
Platelets: 284 10*3/uL (ref 150–400)
RBC: 3.73 MIL/uL — ABNORMAL LOW (ref 3.87–5.11)
RDW: 13.5 % (ref 11.5–15.5)
WBC: 5.9 10*3/uL (ref 4.0–10.5)
nRBC: 0 % (ref 0.0–0.2)

## 2020-03-21 LAB — BASIC METABOLIC PANEL
Anion gap: 11 (ref 5–15)
BUN: 7 mg/dL (ref 6–20)
CO2: 22 mmol/L (ref 22–32)
Calcium: 9.4 mg/dL (ref 8.9–10.3)
Chloride: 107 mmol/L (ref 98–111)
Creatinine, Ser: 0.74 mg/dL (ref 0.44–1.00)
GFR calc Af Amer: >60 mL/min (ref >60)
GFR calc non Af Amer: >60 mL/min (ref >60)
Glucose, Bld: 97 mg/dL (ref 70–99)
Potassium: 3.4 mmol/L — ABNORMAL LOW (ref 3.5–5.1)
Sodium: 140 mmol/L (ref 135–145)

## 2020-03-21 LAB — TROPONIN I (HIGH SENSITIVITY)
Troponin I (High Sensitivity): <2 ng/L (ref <18)
Troponin I (High Sensitivity): <2 ng/L (ref <18)

## 2020-03-21 LAB — I-STAT BETA HCG BLOOD, ED (MC, WL, AP ONLY): I-stat hCG, quantitative: <5 m[IU]/mL (ref <5)

## 2020-03-21 NOTE — ED Triage Notes (Signed)
Pt was at a  school soccer game, started feeling her heart flutter, pain raidating to L shoulder, and headache. Denies previous hx of same.

## 2020-03-22 NOTE — ED Provider Notes (Signed)
[facility] EMERGENCY DEPARTMENT Provider Note   CSN: [id number] Arrival date & time: 03/21/20  1846     History Chief Complaint  Patient presents with  . Headache  . Palpitations    Robin Barajas is a 44 y.o. female.  44 year old female with history of hypertension who presents with palpitations, chest tightness, and left shoulder pain.  Patient reports that prior to arrival yesterday evening, patient was sitting outside watching her son's soccer game when she began having heart racing sensation associated with central chest tightness, intermittent lightheadedness, and intermittent nausea.  She then felt the pain migrates to her left shoulder.  She waited until the end of the soccer game and symptoms continued which is what prompted her to come to the ED for evaluation.  When she arrived to the ED she was still having heart palpitation sensation but symptoms eventually resolved while waiting to be seen.  Currently she feels normal.  She denies any preceding illness and states that she has been well-hydrated throughout the day yesterday.  She denies any recent travel, leg swelling/pain, OCP use, history of blood clots.  She reports having palpitations a long time ago in high school and work-up was overall reassuring, just found to have mitral valve prolapse.  Symptoms eventually resolved and she has not had any problems in years.  She denies any alcohol or drug problems and no excessive caffeine use. No FH heart disease.  The history is provided by the patient.  Headache Palpitations      Past Medical History:  Diagnosis Date  . Generalized headaches   . Hypertension 2003    Patient Active Problem List   Diagnosis Date Noted  . HTN, goal below 130/80 09/17/2017  . Generalized headaches 09/17/2017  . Vitamin D insufficiency 02/24/2017  . Elevated blood pressure reading- borderline many yrs 02/03/2017  . History of diagnosis of MVP (mitral valve prolapse)  02/03/2017  . Overweight (BMI 25.0-29.9) 02/03/2017  . Family history of depression 02/03/2017  . personal history of depression 02/03/2017  . Heart palpitations- random times, lasts secs 02/03/2017  . History of appendectomy 02/03/2017  . Inactivity 02/03/2017    Past Surgical History:  Procedure Laterality Date  . APPENDECTOMY  1990  . ELBOW FRACTURE SURGERY  1984     OB History   No obstetric history on file.     Family History  Problem Relation Age of Onset  . Depression Mother   . Depression Sister   . Alcohol abuse Maternal Grandfather   . Cancer Paternal Grandmother        ovarian  . Diabetes Paternal Grandfather     Social History   Tobacco Use  . Smoking status: Never Smoker  . Smokeless tobacco: Never Used  Vaping Use  . Vaping Use: Never used  Substance Use Topics  . Alcohol use: No  . Drug use: No    Home Medications Prior to Admission medications   Medication Sig Start Date End Date Taking? Authorizing Provider  acetaminophen (TYLENOL) 500 MG tablet Take 500 mg by mouth every 6 (six) hours as needed for mild pain or headache.   Yes [provider]  B Complex Vitamins (B COMPLEX PO) Take 1 tablet by mouth daily.   Yes [provider]  Cholecalciferol (VITAMIN D3) 5000 units TABS 5,000 IU OTC vitamin D3 daily. Patient taking differently: Take 5,000 Units by mouth daily. 5,000 IU OTC vitamin D3 daily. 06/17/17  Yes Opalski, Deborah, DO  ibuprofen (  ADVIL) 200 MG tablet Take 200 mg by mouth every 6 (six) hours as needed for fever, headache or mild pain.   Yes [provider]  losartan-hydrochlorothiazide (HYZAAR) 100-12.5 MG tablet Take 1 tablet by mouth daily. Patient taking differently: Take 0.5 tablets by mouth daily.  09/30/19  Yes Opalski, Deborah, DO    Allergies    Morphine and related, Codeine, and Turmeric  Review of Systems   Review of Systems  Cardiovascular: Positive for palpitations.  Neurological: Positive for  headaches.   All other systems reviewed and are negative except that which was mentioned in HPI  Physical Exam Updated Vital Signs BP (!) 141/88   Pulse 72   Temp 97.8 F (36.6 C) (Oral)   Resp 13   Ht 5\' 5"  (1.651 m)   Wt 70.3 kg   SpO2 100%   BMI 25.79 kg/m   Physical Exam Vitals and nursing note reviewed.  Constitutional:      General: She is not in acute distress.    Appearance: She is well-developed.  HENT:     Head: Normocephalic and atraumatic.     Mouth/Throat:     Mouth: Mucous membranes are moist.     Pharynx: Oropharynx is clear.  Eyes:     Conjunctiva/sclera: Conjunctivae normal.  Cardiovascular:     Rate and Rhythm: Normal rate and regular rhythm.     Heart sounds: Normal heart sounds. No murmur heard.   Pulmonary:     Effort: Pulmonary effort is normal.     Breath sounds: Normal breath sounds.  Abdominal:     General: Bowel sounds are normal. There is no distension.     Palpations: Abdomen is soft.     Tenderness: There is no abdominal tenderness.  Musculoskeletal:     Cervical back: Neck supple.  Skin:    General: Skin is warm and dry.  Neurological:     Mental Status: She is alert and oriented to person, place, and time.     Comments: Fluent speech  Psychiatric:        Mood and Affect: Mood normal.        Judgment: Judgment normal.     Comments: pleasant     ED Results / Procedures / Treatments   Labs (all labs ordered are listed, but only abnormal results are displayed) Labs Reviewed  BASIC METABOLIC PANEL - Abnormal; Notable for the following components:      Result Value   Potassium 3.4 (*)    All other components within normal limits  CBC - Abnormal; Notable for the following components:   RBC 3.73 (*)    Hemoglobin 11.1 (*)    HCT 34.0 (*)    All other components within normal limits  I-STAT BETA HCG BLOOD, ED ([facility], [facility], [facility] ONLY)  TROPONIN I (HIGH SENSITIVITY)  TROPONIN I (HIGH SENSITIVITY)    EKG EKG  Interpretation  Date/Time:  Tuesday March 21 2020 19:23:12 EDT Ventricular Rate:  82 PR Interval:  102 QRS Duration: 86 QT Interval:  382 QTC Calculation: 446 R Axis:   72 Text Interpretation: Sinus rhythm with short PR Otherwise normal ECG When compared with ECG of 03/30/2002, HEART RATE has decreased Confirmed by 05/30/2002 (Dione Booze) on 03/21/2020 11:57:37 PM   Radiology DG Chest 2 View  Result Date: 03/21/2020 CLINICAL DATA:  Chest pain EXAM: CHEST - 2 VIEW COMPARISON:  None. FINDINGS: The heart size and mediastinal contours are within normal limits. Both lungs are clear. The visualized skeletal  structures are unremarkable. IMPRESSION: No active cardiopulmonary disease. Electronically Signed   By: Charlett Nose M.D.   On: 03/21/2020 19:48    Procedures Procedures (including critical care time)  Medications Ordered in ED Medications - No data to display  ED Course  I have reviewed the triage vital signs and the nursing notes.  Pertinent labs & imaging results that were available during my care of the patient were reviewed by me and considered in my medical decision making (see chart for details).    MDM Rules/Calculators/A&P                          Well-appearing and comfortable on exam with reassuring vital signs.  EKG is normal with normal sinus rhythm at normal rate.  She has no risk factors for PE therefore I do not feel she needs work-up for this.  Lab work shows negative serial troponins, potassium 3.4 but otherwise normal BMP, hemoglobin 11.1 but otherwise normal CBC.  Discussed dietary potassium and iron repletion but I doubt that these slight lab abnormalities are contributing to her symptoms. CXR normal.  Her HEART score is <3 and sx very atypical for ACS.  I discussed the possibility of abnormal heart rhythm and encouraged patient to follow-up with PCP if she has recurrent episodes of palpitations as she may need Zio patch in the future.  Discussed supportive measures for  her symptoms.  Extensively reviewed return precautions with her and she voiced understanding. Final Clinical Impression(s) / ED Diagnoses Final diagnoses:  Atypical chest pain  Palpitations    Rx / DC Orders ED Discharge Orders    None       Tariya Morrissette, Ambrose [place], MD 03/22/20 [id number]

## 2020-03-22 NOTE — Discharge Instructions (Addendum)
Eat foods rich in potassium (bananas, orange juice, green leafy vegetables, potatoes) and high in iron (beans, meat, prenatal vitamins) to keep your potassium level normal and your hemoglobin normal. Follow up with primary care provider if you continue to have palpitations episodes.

## 2020-03-22 NOTE — ED Notes (Signed)
Patient given discharge instructions. Questions were answered. Patient verbalized understanding of discharge instructions and care at home.  

## 2020-04-19 ENCOUNTER — Encounter: Payer: Self-pay | Admitting: Physician Assistant

## 2020-04-19 ENCOUNTER — Other Ambulatory Visit: Payer: Self-pay

## 2020-04-19 ENCOUNTER — Ambulatory Visit (INDEPENDENT_AMBULATORY_CARE_PROVIDER_SITE_OTHER): Payer: BC Managed Care – PPO | Admitting: Physician Assistant

## 2020-04-19 VITALS — BP 111/75 | HR 66 | Ht 65.0 in | Wt 157.9 lb

## 2020-04-19 DIAGNOSIS — I10 Essential (primary) hypertension: Secondary | ICD-10-CM | POA: Diagnosis not present

## 2020-04-19 MED ORDER — LOSARTAN POTASSIUM-HCTZ 100-12.5 MG PO TABS: 0.5000 | ORAL_TABLET | Freq: Every day | ORAL | Status: DC

## 2020-04-19 NOTE — Progress Notes (Signed)
Established Patient Office Visit  Subjective:  Patient ID: Robin Barajas, female    DOB: [birth year]  Age: 44 y.o. MRN: [id number]  CC:  Chief Complaint  Patient presents with  . Hypertension    HPI Robin Barajas presents for follow up on hypertension. Pt reports is doing well and has no acute concerns today.  HTN: Pt denies new onset chest pain, palpitations, dizziness or lower extremity swelling. Taking 0.5 tablet of Hyzaar without side effects. Reports her BP machine broke and hasn't been able to replace it (out of stock) but hasn't felt like her blood pressure has been elevated like in the past. Reports good hydration.   Past Medical History:  Diagnosis Date  . Generalized headaches   . Hypertension 2003    Past Surgical History:  Procedure Laterality Date  . APPENDECTOMY  1990  . ELBOW FRACTURE SURGERY  1984    Family History  Problem Relation Age of Onset  . Depression Mother   . Depression Sister   . Alcohol abuse Maternal Grandfather   . Cancer Paternal Grandmother        ovarian  . Diabetes Paternal Grandfather     Social History   Socioeconomic History  . Marital status: Married    Spouse name: Not on file  . Number of children: Not on file  . Years of education: Not on file  . Highest education level: Not on file  Occupational History  . Not on file  Tobacco Use  . Smoking status: Never Smoker  . Smokeless tobacco: Never Used  Vaping Use  . Vaping Use: Never used  Substance and Sexual Activity  . Alcohol use: No  . Drug use: No  . Sexual activity: Yes  Other Topics Concern  . Not on file  Social History Narrative  . Not on file   Social Determinants of Health   Financial Resource Strain:   . Difficulty of Paying Living Expenses: Not on file  Food Insecurity:   . Worried About Charity fundraiser in the Last Year: Not on file  . Ran Out of Food in the Last Year: Not on file  Transportation Needs:   . Lack of Transportation (Medical):  Not on file  . Lack of Transportation (Non-Medical): Not on file  Physical Activity:   . Days of Exercise per Week: Not on file  . Minutes of Exercise per Session: Not on file  Stress:   . Feeling of Stress : Not on file  Social Connections:   . Frequency of Communication with Friends and Family: Not on file  . Frequency of Social Gatherings with Friends and Family: Not on file  . Attends Religious Services: Not on file  . Active Member of Clubs or Organizations: Not on file  . Attends Archivist Meetings: Not on file  . Marital Status: Not on file  Intimate Partner Violence:   . Fear of Current or Ex-Partner: Not on file  . Emotionally Abused: Not on file  . Physically Abused: Not on file  . Sexually Abused: Not on file    Outpatient Medications Prior to Visit  Medication Sig Dispense Refill  . acetaminophen (TYLENOL) 500 MG tablet Take 500 mg by mouth every 6 (six) hours as needed for mild pain or headache.    . B Complex Vitamins (B COMPLEX PO) Take 1 tablet by mouth daily.    . Cholecalciferol (VITAMIN D3) 5000 units TABS 5,000 IU OTC vitamin D3 daily. (Patient  taking differently: Take 5,000 Units by mouth daily. 5,000 IU OTC vitamin D3 daily.) 90 tablet 3  . ibuprofen (ADVIL) 200 MG tablet Take 200 mg by mouth every 6 (six) hours as needed for fever, headache or mild pain.    Marland Kitchen losartan-hydrochlorothiazide (HYZAAR) 100-12.5 MG tablet Take 1 tablet by mouth daily. (Patient taking differently: Take 0.5 tablets by mouth daily. ) 90 tablet 1   No facility-administered medications prior to visit.    Allergies  Allergen Reactions  . Morphine And Related Shortness Of Breath and Nausea And Vomiting  . Codeine Itching  . Turmeric Hives, Itching and Rash    ROS Review of Systems  A fourteen system review of systems was performed and found to be positive as per HPI.  Objective:    Physical Exam General:  Well Developed, well nourished, appropriate for stated age.   Neuro:  Alert and oriented,  extra-ocular muscles intact, no focal deficits  HEENT:  Normocephalic, atraumatic, neck supple Skin:  no gross rash, warm, pink. Cardiac:  RRR, S1 S2 Respiratory:  ECTA B/L and A/P, Not using accessory muscles, speaking in full sentences- unlabored. Vascular:  Ext warm, no cyanosis apprec.; cap RF less 2 sec. No gross edema Psych:  No HI/SI, judgement and insight good, Euthymic mood. Full Affect.  BP 111/75   Pulse 66   Ht 5' 5"  (1.651 m)   Wt 157 lb 14.4 oz (71.6 kg)   LMP  (LMP Unknown)   SpO2 100%   BMI 26.28 kg/m  Wt Readings from Last 3 Encounters:  04/19/20 157 lb 14.4 oz (71.6 kg)  03/21/20 155 lb (70.3 kg)  10/20/19 163 lb 9.6 oz (74.2 kg)     Health Maintenance Due  Topic Date Due  . Hepatitis C Screening  Never done  . INFLUENZA VACCINE  Never done  . COVID-19 Vaccine (2 - Pfizer 2-dose series) 04/06/2020    There are no preventive care reminders to display for this patient.  Lab Results  Component Value Date   TSH 0.984 10/20/2019   Lab Results  Component Value Date   WBC 5.9 03/21/2020   HGB 11.1 (L) 03/21/2020   HCT 34.0 (L) 03/21/2020   MCV 91.2 03/21/2020   PLT 284 03/21/2020   Lab Results  Component Value Date   NA 140 03/21/2020   K 3.4 (L) 03/21/2020   CO2 22 03/21/2020   GLUCOSE 97 03/21/2020   BUN 7 03/21/2020   CREATININE 0.74 03/21/2020   BILITOT 0.5 10/20/2019   ALKPHOS 29 (L) 10/20/2019   AST 16 10/20/2019   ALT 13 10/20/2019   PROT 6.9 10/20/2019   ALBUMIN 4.5 10/20/2019   CALCIUM 9.4 03/21/2020   ANIONGAP 11 03/21/2020   Lab Results  Component Value Date   CHOL 127 10/20/2019   Lab Results  Component Value Date   HDL 60 10/20/2019   Lab Results  Component Value Date   LDLCALC 54 10/20/2019   Lab Results  Component Value Date   TRIG 59 10/20/2019   Lab Results  Component Value Date   CHOLHDL 2.1 10/20/2019   Lab Results  Component Value Date   HGBA1C 5.2 10/20/2019       Assessment & Plan:   Problem List Items Addressed This Visit      Cardiovascular and Mediastinum   HTN, goal below 130/80 - Primary (Chronic)    -BP at goal -Continue current medication regimen. -Follow low sodium diet. -Continue to stay well hydrated. -Checking CMP  today for medication monitoring.      Relevant Medications   losartan-hydrochlorothiazide (HYZAAR) 100-12.5 MG tablet   Other Relevant Orders   Comp Met (CMET)      Meds ordered this encounter  Medications  . losartan-hydrochlorothiazide (HYZAAR) 100-12.5 MG tablet    Sig: Take 0.5 tablets by mouth daily.    Order Specific Question:   Supervising Provider    Answer:   Beatrice Lecher D [[id number]]    Follow-up: Return in about 6 months (around 10/17/2020) for CPE and FBW.    Lorrene Reid, PA-C

## 2020-04-19 NOTE — Assessment & Plan Note (Signed)
-  BP at goal -Continue current medication regimen. -Follow low sodium diet. -Continue to stay well hydrated. -Checking CMP today for medication monitoring.

## 2020-04-19 NOTE — Patient Instructions (Signed)
DASH Eating Plan DASH stands for "Dietary Approaches to Stop Hypertension." The DASH eating plan is a healthy eating plan that has been shown to reduce high blood pressure (hypertension). It may also reduce your risk for type 2 diabetes, heart disease, and stroke. The DASH eating plan may also help with weight loss. What are tips for following this plan?  General guidelines  Avoid eating more than 2,300 mg (milligrams) of salt (sodium) a day. If you have hypertension, you may need to reduce your sodium intake to 1,500 mg a day.  Limit alcohol intake to no more than 1 drink a day for nonpregnant women and 2 drinks a day for men. One drink equals 12 oz of beer, 5 oz of wine, or 1 oz of hard liquor.  Work with your health care provider to maintain a healthy body weight or to lose weight. Ask what an ideal weight is for you.  Get at least 30 minutes of exercise that causes your heart to beat faster (aerobic exercise) most days of the week. Activities may include walking, swimming, or biking.  Work with your health care provider or diet and nutrition specialist (dietitian) to adjust your eating plan to your individual calorie needs. Reading food labels   Check food labels for the amount of sodium per serving. Choose foods with less than 5 percent of the Daily Value of sodium. Generally, foods with less than 300 mg of sodium per serving fit into this eating plan.  To find whole grains, look for the word "whole" as the first word in the ingredient list. Shopping  Buy products labeled as "low-sodium" or "no salt added."  Buy fresh foods. Avoid canned foods and premade or frozen meals. Cooking  Avoid adding salt when cooking. Use salt-free seasonings or herbs instead of table salt or sea salt. Check with your health care provider or pharmacist before using salt substitutes.  Do not fry foods. Cook foods using healthy methods such as baking, boiling, grilling, and broiling instead.  Cook with  heart-healthy oils, such as olive, canola, soybean, or sunflower oil. Meal planning  Eat a balanced diet that includes: ? 5 or more servings of fruits and vegetables each day. At each meal, try to fill half of your plate with fruits and vegetables. ? Up to 6-8 servings of whole grains each day. ? Less than 6 oz of lean meat, poultry, or fish each day. A 3-oz serving of meat is about the same size as a deck of cards. One egg equals 1 oz. ? 2 servings of low-fat dairy each day. ? A serving of nuts, seeds, or beans 5 times each week. ? Heart-healthy fats. Healthy fats called Omega-3 fatty acids are found in foods such as flaxseeds and coldwater fish, like sardines, salmon, and mackerel.  Limit how much you eat of the following: ? Canned or prepackaged foods. ? Food that is high in trans fat, such as fried foods. ? Food that is high in saturated fat, such as fatty meat. ? Sweets, desserts, sugary drinks, and other foods with added sugar. ? Full-fat dairy products.  Do not salt foods before eating.  Try to eat at least 2 vegetarian meals each week.  Eat more home-cooked food and less restaurant, buffet, and fast food.  When eating at a restaurant, ask that your food be prepared with less salt or no salt, if possible. What foods are recommended? The items listed may not be a complete list. Talk with your dietitian about   what dietary choices are best for you. Grains Whole-grain or whole-wheat bread. Whole-grain or whole-wheat pasta. Brown rice. Oatmeal. Quinoa. Bulgur. Whole-grain and low-sodium cereals. Pita bread. Low-fat, low-sodium crackers. Whole-wheat flour tortillas. Vegetables Fresh or frozen vegetables (raw, steamed, roasted, or grilled). Low-sodium or reduced-sodium tomato and vegetable juice. Low-sodium or reduced-sodium tomato sauce and tomato paste. Low-sodium or reduced-sodium canned vegetables. Fruits All fresh, dried, or frozen fruit. Canned fruit in natural juice (without  added sugar). Meat and other protein foods Skinless chicken or turkey. Ground chicken or turkey. Pork with fat trimmed off. Fish and seafood. Egg whites. Dried beans, peas, or lentils. Unsalted nuts, nut butters, and seeds. Unsalted canned beans. Lean cuts of beef with fat trimmed off. Low-sodium, lean deli meat. Dairy Low-fat (1%) or fat-free (skim) milk. Fat-free, low-fat, or reduced-fat cheeses. Nonfat, low-sodium ricotta or cottage cheese. Low-fat or nonfat yogurt. Low-fat, low-sodium cheese. Fats and oils Soft margarine without trans fats. Vegetable oil. Low-fat, reduced-fat, or light mayonnaise and salad dressings (reduced-sodium). Canola, safflower, olive, soybean, and sunflower oils. Avocado. Seasoning and other foods Herbs. Spices. Seasoning mixes without salt. Unsalted popcorn and pretzels. Fat-free sweets. What foods are not recommended? The items listed may not be a complete list. Talk with your dietitian about what dietary choices are best for you. Grains Baked goods made with fat, such as croissants, muffins, or some breads. Dry pasta or rice meal packs. Vegetables Creamed or fried vegetables. Vegetables in a cheese sauce. Regular canned vegetables (not low-sodium or reduced-sodium). Regular canned tomato sauce and paste (not low-sodium or reduced-sodium). Regular tomato and vegetable juice (not low-sodium or reduced-sodium). Pickles. Olives. Fruits Canned fruit in a light or heavy syrup. Fried fruit. Fruit in cream or butter sauce. Meat and other protein foods Fatty cuts of meat. Ribs. Fried meat. Bacon. Sausage. Bologna and other processed lunch meats. Salami. Fatback. Hotdogs. Bratwurst. Salted nuts and seeds. Canned beans with added salt. Canned or smoked fish. Whole eggs or egg yolks. Chicken or turkey with skin. Dairy Whole or 2% milk, cream, and half-and-half. Whole or full-fat cream cheese. Whole-fat or sweetened yogurt. Full-fat cheese. Nondairy creamers. Whipped toppings.  Processed cheese and cheese spreads. Fats and oils Butter. Stick margarine. Lard. Shortening. Ghee. Bacon fat. Tropical oils, such as coconut, palm kernel, or palm oil. Seasoning and other foods Salted popcorn and pretzels. Onion salt, garlic salt, seasoned salt, table salt, and sea salt. Worcestershire sauce. Tartar sauce. Barbecue sauce. Teriyaki sauce. Soy sauce, including reduced-sodium. Steak sauce. Canned and packaged gravies. Fish sauce. Oyster sauce. Cocktail sauce. Horseradish that you find on the shelf. Ketchup. Mustard. Meat flavorings and tenderizers. Bouillon cubes. Hot sauce and Tabasco sauce. Premade or packaged marinades. Premade or packaged taco seasonings. Relishes. Regular salad dressings. Where to find more information:  National Heart, Lung, and Blood Institute: www.nhlbi.nih.gov  American Heart Association: www.heart.org Summary  The DASH eating plan is a healthy eating plan that has been shown to reduce high blood pressure (hypertension). It may also reduce your risk for type 2 diabetes, heart disease, and stroke.  With the DASH eating plan, you should limit salt (sodium) intake to 2,300 mg a day. If you have hypertension, you may need to reduce your sodium intake to 1,500 mg a day.  When on the DASH eating plan, aim to eat more fresh fruits and vegetables, whole grains, lean proteins, low-fat dairy, and heart-healthy fats.  Work with your health care provider or diet and nutrition specialist (dietitian) to adjust your eating plan to your   individual calorie needs. This information is not intended to replace advice given to you by your health care provider. Make sure you discuss any questions you have with your health care provider. Document Revised: 06/27/2017 Document Reviewed: 07/08/2016 Elsevier Patient Education  2020 [employer]  

## 2020-04-20 LAB — COMPREHENSIVE METABOLIC PANEL
ALT: 20 IU/L (ref 0–32)
AST: 18 IU/L (ref 0–40)
Albumin/Globulin Ratio: 1.8 (ref 1.2–2.2)
Albumin: 4.2 g/dL (ref 3.8–4.8)
Alkaline Phosphatase: 35 IU/L — ABNORMAL LOW (ref 44–121)
BUN/Creatinine Ratio: 12 (ref 9–23)
BUN: 9 mg/dL (ref 6–24)
Bilirubin Total: 0.3 mg/dL (ref 0.0–1.2)
CO2: 24 mmol/L (ref 20–29)
Calcium: 9.3 mg/dL (ref 8.7–10.2)
Chloride: 102 mmol/L (ref 96–106)
Creatinine, Ser: 0.77 mg/dL (ref 0.57–1.00)
GFR calc Af Amer: 109 mL/min/{1.73_m2} (ref >59)
GFR calc non Af Amer: 94 mL/min/{1.73_m2} (ref >59)
Globulin, Total: 2.3 g/dL (ref 1.5–4.5)
Glucose: 79 mg/dL (ref 65–99)
Potassium: 4.4 mmol/L (ref 3.5–5.2)
Sodium: 138 mmol/L (ref 134–144)
Total Protein: 6.5 g/dL (ref 6.0–8.5)

## 2020-10-04 ENCOUNTER — Telehealth: Payer: Self-pay | Admitting: Physician Assistant

## 2020-10-04 ENCOUNTER — Other Ambulatory Visit: Payer: Self-pay | Admitting: Physician Assistant

## 2020-10-04 ENCOUNTER — Ambulatory Visit (INDEPENDENT_AMBULATORY_CARE_PROVIDER_SITE_OTHER): Payer: BC Managed Care – PPO | Admitting: Physician Assistant

## 2020-10-04 ENCOUNTER — Other Ambulatory Visit: Payer: Self-pay

## 2020-10-04 ENCOUNTER — Encounter: Payer: Self-pay | Admitting: Physician Assistant

## 2020-10-04 VITALS — BP 121/82 | HR 70 | Temp 97.1°F | Ht 65.0 in | Wt 160.1 lb

## 2020-10-04 DIAGNOSIS — Z0000 Encounter for general adult medical examination without abnormal findings: Secondary | ICD-10-CM | POA: Diagnosis not present

## 2020-10-04 DIAGNOSIS — I10 Essential (primary) hypertension: Secondary | ICD-10-CM | POA: Diagnosis not present

## 2020-10-04 DIAGNOSIS — E559 Vitamin D deficiency, unspecified: Secondary | ICD-10-CM | POA: Diagnosis not present

## 2020-10-04 DIAGNOSIS — E663 Overweight: Secondary | ICD-10-CM | POA: Diagnosis not present

## 2020-10-04 MED ORDER — HYDROCHLOROTHIAZIDE 12.5 MG PO CAPS: ORAL_CAPSULE | ORAL | 0 refills | Status: DC

## 2020-10-04 MED ORDER — LOSARTAN POTASSIUM 100 MG PO TABS: 50.0000 mg | ORAL_TABLET | Freq: Every day | ORAL | 0 refills | Status: DC

## 2020-10-04 MED ORDER — LOSARTAN POTASSIUM-HCTZ 100-12.5 MG PO TABS: 0.5000 | ORAL_TABLET | Freq: Every day | ORAL | 1 refills | Status: DC

## 2020-10-04 NOTE — Progress Notes (Signed)
  Subjective:     Robin Barajas is a 45 y.o. female and is here for a comprehensive physical exam. The patient reports no problems.  Social History   Socioeconomic History  . Marital status: Married    Spouse name: Not on file  . Number of children: Not on file  . Years of education: Not on file  . Highest education level: Not on file  Occupational History  . Not on file  Tobacco Use  . Smoking status: Never Smoker  . Smokeless tobacco: Never Used  Vaping Use  . Vaping Use: Never used  Substance and Sexual Activity  . Alcohol use: No  . Drug use: No  . Sexual activity: Yes  Other Topics Concern  . Not on file  Social History Narrative  . Not on file   Social Determinants of Health   Financial Resource Strain: Not on file  Food Insecurity: Not on file  Transportation Needs: Not on file  Physical Activity: Not on file  Stress: Not on file  Social Connections: Not on file  Intimate Partner Violence: Not on file   Health Maintenance  Topic Date Due  . Hepatitis C Screening  Never done  . INFLUENZA VACCINE  Never done  . COVID-19 Vaccine (2 - Pfizer 3-dose series) 04/06/2020  . PAP SMEAR-Modifier  09/12/2020  . TETANUS/TDAP  03/12/2028  . HIV Screening  Completed  . HPV VACCINES  Aged Out    The following portions of the patient's history were reviewed and updated as appropriate: allergies, current medications, past family history, past medical history, past social history, past surgical history and problem list.  Review of Systems Pertinent items noted in HPI and remainder of comprehensive ROS otherwise negative.   Objective:    BP 121/82   Pulse 70   Temp (!) 97.1 F (36.2 C)   Ht 5\' 5"  (1.651 m)   Wt 160 lb 1.6 oz (72.6 kg)   SpO2 100%   BMI 26.64 kg/m  General appearance: alert, cooperative and no distress Head: Normocephalic, without obvious abnormality, atraumatic Eyes: conjunctivae/corneas clear. PERRL, EOM's intact. Fundi benign. Ears: normal  TM's and external ear canals both ears Nose: Nares normal. Septum midline. Mucosa normal. No drainage or sinus tenderness. Throat: lips, mucosa, and tongue normal; teeth and gums normal Neck: no adenopathy, no JVD, supple, symmetrical, trachea midline and thyroid not enlarged, symmetric, no tenderness/mass/nodules Back: symmetric, no curvature. ROM normal. No CVA tenderness. Lungs: clear to auscultation bilaterally Breasts: Deferred to Ob-Gyn Heart: regular rate and rhythm, S1, S2 normal, no murmur, click, rub or gallop Abdomen: soft, non-tender; bowel sounds normal; no masses,  no organomegaly Pelvic: deferred Extremities: extremities normal, atraumatic, no cyanosis or edema Pulses: 2+ and symmetric Skin: Skin color, texture, turgor normal. No rashes or lesions Lymph nodes: Cervical adenopathy: normal  and Supraclavicular adenopathy: normal Neurologic: Grossly normal    Assessment:    Healthy female exam.      Plan:  -BP well controlled. -Will request most recent pap/mammogram from [facility]. UTD on immunizations.  -Will obtain fasting blood work. -Continue walking regimen (ultimate goal of 150 min/wk), follow a heart healthy diet and continue good hydration. -Follow up in 6 months for HTN and repeat CMP for medication monitoring.    See After Visit Summary for Counseling Recommendations

## 2020-10-04 NOTE — Patient Instructions (Signed)
Preventive Care 11-45 Years Old, Female Preventive care refers to lifestyle choices and visits with your health care provider that can promote health and wellness. This includes:  A yearly physical exam. This is also called an annual wellness visit.  Regular dental and eye exams.  Immunizations.  Screening for certain conditions.  Healthy lifestyle choices, such as: ? Eating a healthy diet. ? Getting regular exercise. ? Not using drugs or products that contain nicotine and tobacco. ? Limiting alcohol use. What can I expect for my preventive care visit? Physical exam Your health care provider may check your:  Height and weight. These may be used to calculate your BMI (body mass index). BMI is a measurement that tells if you are at a healthy weight.  Heart rate and blood pressure.  Body temperature.  Skin for abnormal spots. Counseling Your health care provider may ask you questions about your:  Past medical problems.  Family's medical history.  Alcohol, tobacco, and drug use.  Emotional well-being.  Home life and relationship well-being.  Sexual activity.  Diet, exercise, and sleep habits.  Work and work Statistician.  Access to firearms.  Method of birth control.  Menstrual cycle.  Pregnancy history. What immunizations do I need? Vaccines are usually given at various ages, according to a schedule. Your health care provider will recommend vaccines for you based on your age, medical history, and lifestyle or other factors, such as travel or where you work.   What tests do I need? Blood tests  Lipid and cholesterol levels. These may be checked every 5 years starting at age 45.  Hepatitis C test.  Hepatitis B test. Screening  Diabetes screening. This is done by checking your blood sugar (glucose) after you have not eaten for a while (fasting).  STD (sexually transmitted disease) testing, if you are at risk.  BRCA-related cancer screening. This may  be done if you have a family history of breast, ovarian, tubal, or peritoneal cancers.  Pelvic exam and Pap test. This may be done every 3 years starting at age 45. Starting at age 45, this may be done every 5 years if you have a Pap test in combination with an HPV test. Talk with your health care provider about your test results, treatment options, and if necessary, the need for more tests.   Follow these instructions at home: Eating and drinking  Eat a healthy diet that includes fresh fruits and vegetables, whole grains, lean protein, and low-fat dairy products.  Take vitamin and mineral supplements as recommended by your health care provider.  Do not drink alcohol if: ? Your health care provider tells you not to drink. ? You are pregnant, may be pregnant, or are planning to become pregnant.  If you drink alcohol: ? Limit how much you have to 0-1 drink a day. ? Be aware of how much alcohol is in your drink. In the [place] one drink equals one 12 oz bottle of beer (355 mL), one 5 oz glass of wine (148 mL), or one 1 oz glass of hard liquor (44 mL).   Lifestyle  Take daily care of your teeth and gums. Brush your teeth every morning and night with fluoride toothpaste. Floss one time each day.  Stay active. Exercise for at least 30 minutes 5 or more days each week.  Do not use any products that contain nicotine or tobacco, such as cigarettes, e-cigarettes, and chewing tobacco. If you need help quitting, ask your health care provider.  Do  not use drugs.  If you are sexually active, practice safe sex. Use a condom or other form of protection to prevent STIs (sexually transmitted infections).  If you do not wish to become pregnant, use a form of birth control. If you plan to become pregnant, see your health care provider for a prepregnancy visit.  Find healthy ways to cope with stress, such as: ? Meditation, yoga, or listening to music. ? Journaling. ? Talking to a trusted  person. ? Spending time with friends and family. Safety  Always wear your seat belt while driving or riding in a vehicle.  Do not drive: ? If you have been drinking alcohol. Do not ride with someone who has been drinking. ? When you are tired or distracted. ? While texting.  Wear a helmet and other protective equipment during sports activities.  If you have firearms in your house, make sure you follow all gun safety procedures.  Seek help if you have been physically or sexually abused. What's next?  Go to your health care provider once a year for an annual wellness visit.  Ask your health care provider how often you should have your eyes and teeth checked.  Stay up to date on all vaccines. This information is not intended to replace advice given to you by your health care provider. Make sure you discuss any questions you have with your health care provider. Document Revised: 03/12/2020 Document Reviewed: 03/26/2018 Elsevier Patient Education  2021 [employer].

## 2020-10-04 NOTE — Telephone Encounter (Signed)
Per Kandis Cocking sending losartan and hctz separate due to pharmacy shortage of combo medication. AS, CMA

## 2020-10-06 LAB — COMPREHENSIVE METABOLIC PANEL
ALT: 9 IU/L (ref 0–32)
AST: 16 IU/L (ref 0–40)
Albumin/Globulin Ratio: 1.9 (ref 1.2–2.2)
Albumin: 4.4 g/dL (ref 3.8–4.8)
Alkaline Phosphatase: 32 IU/L — ABNORMAL LOW (ref 44–121)
BUN/Creatinine Ratio: 15 (ref 9–23)
BUN: 12 mg/dL (ref 6–24)
Bilirubin Total: 0.5 mg/dL (ref 0.0–1.2)
CO2: 20 mmol/L (ref 20–29)
Calcium: 9.3 mg/dL (ref 8.7–10.2)
Chloride: 102 mmol/L (ref 96–106)
Creatinine, Ser: 0.82 mg/dL (ref 0.57–1.00)
Globulin, Total: 2.3 g/dL (ref 1.5–4.5)
Glucose: 76 mg/dL (ref 65–99)
Potassium: 4.2 mmol/L (ref 3.5–5.2)
Sodium: 137 mmol/L (ref 134–144)
Total Protein: 6.7 g/dL (ref 6.0–8.5)
eGFR: 90 mL/min/{1.73_m2} (ref >59)

## 2020-10-06 LAB — LIPID PANEL
Chol/HDL Ratio: 2.4 ratio (ref 0.0–4.4)
Cholesterol, Total: 171 mg/dL (ref 100–199)
HDL: 71 mg/dL (ref >39)
LDL Chol Calc (NIH): 87 mg/dL (ref 0–99)
Triglycerides: 70 mg/dL (ref 0–149)
VLDL Cholesterol Cal: 13 mg/dL (ref 5–40)

## 2020-10-06 LAB — CBC
Hematocrit: 37.5 % (ref 34.0–46.6)
Hemoglobin: 12.3 g/dL (ref 11.1–15.9)
MCH: 28.4 pg (ref 26.6–33.0)
MCHC: 32.8 g/dL (ref 31.5–35.7)
MCV: 87 fL (ref 79–97)
Platelets: 272 10*3/uL (ref 150–450)
RBC: 4.33 x10E6/uL (ref 3.77–5.28)
RDW: 14.5 % (ref 11.7–15.4)
WBC: 4.4 10*3/uL (ref 3.4–10.8)

## 2020-10-06 LAB — HEMOGLOBIN A1C
Est. average glucose Bld gHb Est-mCnc: 111 mg/dL
Hgb A1c MFr Bld: 5.5 % (ref 4.8–5.6)

## 2020-10-06 LAB — VITAMIN D 25 HYDROXY (VIT D DEFICIENCY, FRACTURES): Vit D, 25-Hydroxy: 46.6 ng/mL (ref 30.0–100.0)

## 2020-10-06 LAB — TSH: TSH: 0.985 u[IU]/mL (ref 0.450–4.500)

## 2020-10-25 ENCOUNTER — Encounter: Payer: Self-pay | Admitting: Physician Assistant

## 2020-12-01 ENCOUNTER — Other Ambulatory Visit: Payer: Self-pay

## 2020-12-01 ENCOUNTER — Ambulatory Visit
Admission: EM | Admit: 2020-12-01 | Discharge: 2020-12-01 | Disposition: A | Discharge status: Home / Self Care | Payer: BC Managed Care – PPO | Attending: Internal Medicine | Admitting: Internal Medicine

## 2020-12-01 ENCOUNTER — Encounter: Payer: Self-pay | Admitting: Physician Assistant

## 2020-12-01 DIAGNOSIS — Z20822 Contact with and (suspected) exposure to covid-19: Secondary | ICD-10-CM | POA: Diagnosis not present

## 2020-12-01 DIAGNOSIS — J029 Acute pharyngitis, unspecified: Secondary | ICD-10-CM | POA: Insufficient documentation

## 2020-12-01 DIAGNOSIS — Z1211 Encounter for screening for malignant neoplasm of colon: Secondary | ICD-10-CM

## 2020-12-01 LAB — POCT RAPID STREP A (OFFICE): Rapid Strep A Screen: NEGATIVE

## 2020-12-01 NOTE — ED Triage Notes (Signed)
Pt present sore throat with difficulty swallowing. Symptoms started on Wednesday. Pt states it feel like glass is cutting her throat.

## 2020-12-01 NOTE — ED Provider Notes (Signed)
[facility]    CSN: [id number] Arrival date & time: 12/01/20  1220      History   Chief Complaint Chief Complaint  Patient presents with  . Sore Throat    HPI Robin Barajas is a 45 y.o. female comes to the urgent care with a 2-day history of difficulty swallowing.  Patient denies any preceding symptoms.  She denies any runny nose or nasal congestion.  She has some pain on swallowing.  No nausea or vomiting.  No body aches.  No sick contacts.  Patient is a pretty care teacher.   She is fully vaccinated against COVID-19 virus.  HPI  Past Medical History:  Diagnosis Date  . Generalized headaches   . Hypertension 2003    Patient Active Problem List   Diagnosis Date Noted  . HTN, goal below 130/80 09/17/2017  . Generalized headaches 09/17/2017  . Vitamin D insufficiency 02/24/2017  . Elevated blood pressure reading- borderline many yrs 02/03/2017  . History of diagnosis of MVP (mitral valve prolapse) 02/03/2017  . Overweight (BMI 25.0-29.9) 02/03/2017  . Family history of depression 02/03/2017  . personal history of depression 02/03/2017  . Heart palpitations- random times, lasts secs 02/03/2017  . History of appendectomy 02/03/2017  . Inactivity 02/03/2017    Past Surgical History:  Procedure Laterality Date  . APPENDECTOMY  1990  . ELBOW FRACTURE SURGERY  1984  . FRACTURE SURGERY N/A    [employer] 10/03/2020    OB History   No obstetric history on file.      Home Medications    Prior to Admission medications   Medication Sig Start Date End Date Taking? Authorizing Provider  acetaminophen (TYLENOL) 500 MG tablet Take 500 mg by mouth every 6 (six) hours as needed for mild pain or headache.    [provider]  Cholecalciferol (VITAMIN D3) 5000 units TABS 5,000 IU OTC vitamin D3 daily. Patient taking differently: Take 5,000 Units by mouth daily. 5,000 IU OTC vitamin D3 daily. 06/17/17   Thomasene Lot, DO  hydrochlorothiazide (MICROZIDE)  12.5 MG capsule 0.5 tablet by mouth once daily 10/04/20   Abonza, Maritza, PA-C  ibuprofen (ADVIL) 200 MG tablet Take 200 mg by mouth every 6 (six) hours as needed for fever, headache or mild pain.    [provider]  losartan (COZAAR) 100 MG tablet Take 0.5 tablets (50 mg total) by mouth daily. 10/04/20   Mayer Masker, PA-C    Family History Family History  Problem Relation Age of Onset  . Depression Mother   . Depression Sister   . Alcohol abuse Maternal Grandfather   . Cancer Paternal Grandmother        ovarian  . Diabetes Paternal Grandfather     Social History Social History   Tobacco Use  . Smoking status: Never Smoker  . Smokeless tobacco: Never Used  Vaping Use  . Vaping Use: Never used  Substance Use Topics  . Alcohol use: No  . Drug use: No     Allergies   Morphine and related, Other, Codeine, and Turmeric   Review of Systems Review of Systems  HENT: Positive for sore throat. Negative for ear discharge, ear pain, sinus pressure and sinus pain.   Respiratory: Negative.   Gastrointestinal: Negative.      Physical Exam Triage Vital Signs ED Triage Vitals [12/01/20 1354]  Enc Vitals Group     BP 132/87     Pulse Rate 89     Resp 16  Temp 99.1 F (37.3 C)     Temp Source Oral     SpO2 98 %     Weight      Height      Head Circumference      Peak Flow      Pain Score 8     Pain Loc      Pain Edu?      Excl. in GC?    No data found.  Updated Vital Signs BP 132/87 (BP Location: Left Arm)   Pulse 89   Temp 99.1 F (37.3 C) (Oral)   Resp 16   LMP 11/13/2020   SpO2 98%   Visual Acuity Right Eye Distance:   Left Eye Distance:   Bilateral Distance:    Right Eye Near:   Left Eye Near:    Bilateral Near:     Physical Exam Vitals and nursing note reviewed.  Constitutional:      General: She is not in acute distress.    Appearance: She is not ill-appearing.  HENT:     Right Ear: Tympanic membrane normal.     Left Ear:  Tympanic membrane normal.     Mouth/Throat:     Mouth: Mucous membranes are moist.     Pharynx: No posterior oropharyngeal erythema.     Tonsils: 0 on the right. 0 on the left.  Cardiovascular:     Rate and Rhythm: Normal rate and regular rhythm.  Neurological:     Mental Status: She is alert.      UC Treatments / Results  Labs (all labs ordered are listed, but only abnormal results are displayed) Labs Reviewed  CULTURE, GROUP A STREP ([facility])  NOVEL CORONAVIRUS, NAA  POCT RAPID STREP A (OFFICE)    EKG   Radiology No results found.  Procedures Procedures (including critical care time)  Medications Ordered in UC Medications - No data to display  Initial Impression / Assessment and Plan / UC Course  I have reviewed the triage vital signs and the nursing notes.  Pertinent labs & imaging results that were available during my care of the patient were reviewed by me and considered in my medical decision making (see chart for details).    1.  Acute pharyngitis: Warm salt water gargle Point-of-care strep test is negative COVID-19 PCR test is pending Throat cultures have been sent Tylenol as needed for pain Return to urgent care if symptoms worsen. We will call you with recommendations if labs are abnormal. Final Clinical Impressions(s) / UC Diagnoses   Final diagnoses:  Acute pharyngitis, unspecified etiology     Discharge Instructions     Warm salt water gargle Cepacol lozenges Increase oral fluid intake Return to urgent care if symptoms worsen.   ED Prescriptions    None     PDMP not reviewed this encounter.   Merrilee Jansky, MD 12/01/20 1534

## 2020-12-01 NOTE — Discharge Instructions (Addendum)
Warm salt water gargle Cepacol lozenges Increase oral fluid intake Return to urgent care if symptoms worsen.

## 2020-12-02 LAB — NOVEL CORONAVIRUS, NAA: SARS-CoV-2, NAA: NOT DETECTED

## 2020-12-02 LAB — SARS-COV-2, NAA 2 DAY TAT

## 2020-12-03 LAB — CULTURE, GROUP A STREP (THRC)

## 2020-12-04 ENCOUNTER — Telehealth: Payer: Self-pay | Admitting: Physician Assistant

## 2020-12-04 NOTE — Telephone Encounter (Signed)
Patient left a voicemail stating she went to Urgent Care last week and was tested for Strep and COVID and they were both negative. Patient has white patches, fatigue, and a headache. Patient was called back and made aware our soonest appointment is next week and also told she go to Urgent Care. She said she would wait and see if she feels better by next week and call back if not.

## 2020-12-06 DIAGNOSIS — R5383 Other fatigue: Secondary | ICD-10-CM | POA: Diagnosis not present

## 2020-12-06 DIAGNOSIS — J029 Acute pharyngitis, unspecified: Secondary | ICD-10-CM | POA: Diagnosis not present

## 2021-01-01 DIAGNOSIS — Z01419 Encounter for gynecological examination (general) (routine) without abnormal findings: Secondary | ICD-10-CM | POA: Diagnosis not present

## 2021-01-01 DIAGNOSIS — Z6828 Body mass index (BMI) 28.0-28.9, adult: Secondary | ICD-10-CM | POA: Diagnosis not present

## 2021-01-01 DIAGNOSIS — Z1231 Encounter for screening mammogram for malignant neoplasm of breast: Secondary | ICD-10-CM | POA: Diagnosis not present

## 2021-01-07 LAB — HM PAP SMEAR: HM Pap smear: NEGATIVE

## 2021-01-26 ENCOUNTER — Encounter: Payer: Self-pay | Admitting: Physician Assistant

## 2021-01-26 DIAGNOSIS — Z8271 Family history of polycystic kidney: Secondary | ICD-10-CM

## 2021-02-01 ENCOUNTER — Ambulatory Visit
Admission: RE | Admit: 2021-02-01 | Discharge: 2021-02-01 | Disposition: A | Discharge status: Home / Self Care | Payer: BC Managed Care – PPO | Source: Ambulatory Visit | Attending: Physician Assistant | Admitting: Physician Assistant

## 2021-02-01 DIAGNOSIS — Z8271 Family history of polycystic kidney: Secondary | ICD-10-CM

## 2021-02-01 DIAGNOSIS — N1330 Unspecified hydronephrosis: Secondary | ICD-10-CM | POA: Diagnosis not present

## 2021-02-13 ENCOUNTER — Other Ambulatory Visit: Payer: Self-pay | Admitting: Physician Assistant

## 2021-02-13 DIAGNOSIS — I10 Essential (primary) hypertension: Secondary | ICD-10-CM

## 2021-04-06 ENCOUNTER — Ambulatory Visit: Payer: BC Managed Care – PPO | Admitting: Physician Assistant

## 2021-04-10 ENCOUNTER — Encounter: Payer: Self-pay | Admitting: Gastroenterology

## 2021-04-10 ENCOUNTER — Ambulatory Visit (AMBULATORY_SURGERY_CENTER): Payer: BC Managed Care – PPO | Admitting: *Deleted

## 2021-04-10 ENCOUNTER — Other Ambulatory Visit: Payer: Self-pay

## 2021-04-10 VITALS — Ht 65.0 in | Wt 170.0 lb

## 2021-04-10 DIAGNOSIS — Z1211 Encounter for screening for malignant neoplasm of colon: Secondary | ICD-10-CM

## 2021-04-10 MED ORDER — PEG-KCL-NACL-NASULF-NA ASC-C 100 G PO SOLR: 1.0000 | Freq: Once | ORAL | 0 refills | Status: AC

## 2021-04-10 NOTE — Progress Notes (Signed)
No egg or soy allergy known to patient  No issues known to pt with past sedation with any surgeries or procedures- morphine caused vomiting  Patient denies ever being told they had issues or difficulty with intubation  No FH of Malignant Hyperthermia Pt is not on diet pills Pt is not on  home 02  Pt is not on blood thinners  Pt denies issues with constipation  No A fib or A flutter  EMMI video to pt or via MyChart  COVID 19 guidelines implemented in PV today with Pt and RN   Pt is fully vaccinated  for Covid   Due to the COVID-19 pandemic we are asking patients to follow certain guidelines.  Pt aware of COVID protocols and [facility] guidelines   Pt verified name, DOB, address and insurance during PV today.  PV completed over the phone.  Pt encouraged to call with questions or issues.  My Chart instructions to pt as well and emailed her instructions at [email or web address]- I also emailed her a Moviprep coupon to this email address- pt to print off from email or MyChart and will call with questions- unable to mail packet as procedure is Monday

## 2021-04-11 ENCOUNTER — Ambulatory Visit (INDEPENDENT_AMBULATORY_CARE_PROVIDER_SITE_OTHER): Payer: BC Managed Care – PPO

## 2021-04-11 ENCOUNTER — Ambulatory Visit (INDEPENDENT_AMBULATORY_CARE_PROVIDER_SITE_OTHER): Payer: BC Managed Care – PPO | Admitting: Physician Assistant

## 2021-04-11 ENCOUNTER — Other Ambulatory Visit: Payer: Self-pay | Admitting: Physician Assistant

## 2021-04-11 ENCOUNTER — Encounter: Payer: Self-pay | Admitting: Physician Assistant

## 2021-04-11 VITALS — BP 120/78 | HR 74 | Temp 98.1°F | Ht 65.0 in | Wt 168.0 lb

## 2021-04-11 DIAGNOSIS — Z23 Encounter for immunization: Secondary | ICD-10-CM

## 2021-04-11 DIAGNOSIS — I10 Essential (primary) hypertension: Secondary | ICD-10-CM | POA: Diagnosis not present

## 2021-04-11 DIAGNOSIS — R002 Palpitations: Secondary | ICD-10-CM

## 2021-04-11 NOTE — Progress Notes (Unsigned)
Patient enrolled for [employer] to mail a 3 day ZIO XT monitor to her address on file. 

## 2021-04-11 NOTE — Assessment & Plan Note (Signed)
-  Stable. -Discussed with patient will continue Losartan 25 mg. If BP consistently >130/85 then recommend increasing Losartan. Patient verbalized understanding. Will discontinue HCTZ. -Discussed low sodium diet, adequate hydration and weight loss.

## 2021-04-11 NOTE — Progress Notes (Signed)
Established Patient Office Visit  Subjective:  Patient ID: Robin Barajas, female    DOB: 14-Dec-1975  Age: 45 y.o. MRN: [id number]  CC:  Chief Complaint  Patient presents with   Follow-up   Hypertension     HPI Robin Barajas presents for follow up on hypertension. Does report random chest flutters. Pt denies chest pain, dizziness, syncope or lower extremity swelling. 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 Due to urinary frequency especially during school stopped water pill- HCTZ a few weeks ago. Reports compliance with Losartan. Checks BP at home and readings range <135/80. Pt follows a low salt diet. States not drinking as much water as before since starting school again.    Past Medical History:  Diagnosis Date   Allergy    Generalized headaches    Heart murmur    Hypertension 2003    Past Surgical History:  Procedure Laterality Date   APPENDECTOMY  1990   ELBOW FRACTURE SURGERY  1984   WISDOM TOOTH EXTRACTION      Family History  Problem Relation Age of Onset   Depression Mother    Colon polyps Father    Depression Sister    Alcohol abuse Maternal Grandfather    Cancer Paternal Grandmother        ovarian   Diabetes Paternal Grandfather    Colon cancer Neg Hx    Esophageal cancer Neg Hx    Rectal cancer Neg Hx    Stomach cancer Neg Hx     Social History   Socioeconomic History   Marital status: Married    Spouse name: Not on file   Number of children: Not on file   Years of education: Not on file   Highest education level: Not on file  Occupational History   Not on file  Tobacco Use   Smoking status: Never   Smokeless tobacco: Never  Vaping Use   Vaping Use: Never used  Substance and Sexual Activity   Alcohol use: No   Drug use: No   Sexual activity: Yes  Other Topics Concern   Not on file  Social History Narrative   Not on file   Social Determinants of Health    Financial Resource Strain: Not on file  Food Insecurity: Not on file  Transportation Needs: Not on file  Physical Activity: Not on file  Stress: Not on file  Social Connections: Not on file  Intimate Partner Violence: Not on file    Outpatient Medications Prior to Visit  Medication Sig Dispense Refill   acetaminophen (TYLENOL) 500 MG tablet Take 500 mg by mouth every 6 (six) hours as needed for mild pain or headache.     Cholecalciferol (VITAMIN D3) 5000 units TABS 5,000 IU OTC vitamin D3 daily. (Patient taking differently: Take 5,000 Units by mouth daily. 5,000 IU OTC vitamin D3 daily.) 90 tablet 3   hydrochlorothiazide (MICROZIDE) 12.5 MG capsule TAKE 1/2 TABLET BY MOUTH ONCE DAILY 15 capsule 2   ibuprofen (ADVIL) 200 MG tablet Take 200 mg by mouth every 6 (six) hours as needed for fever, headache or mild pain.     losartan (COZAAR) 25 MG tablet TAKE 2 TABS BY MOUTH DAILY 60 tablet 2   No facility-administered medications prior to visit.    Allergies  Allergen Reactions   Morphine And Related Shortness Of Breath and Nausea And Vomiting   Codeine Itching   Turmeric  Hives, Itching and Rash    ROS Review of Systems A fourteen system review of systems was performed and found to be positive as per HPI.   Objective:    Physical Exam General:  Well Developed, well nourished, in no acute distress Neuro:  Alert and oriented,  extra-ocular muscles intact, PERRL, CN II-XII grossly intact  HEENT:  Normocephalic, atraumatic, neck supple  Skin:  no gross rash, warm, pink. Cardiac:  RRR, S1 S2, no murmur  Respiratory:  CTA B/L, Not using accessory muscles, speaking in full sentences- unlabored. Vascular:  Ext warm, no cyanosis apprec.; cap RF less 2 sec. Psych:  No HI/SI, judgement and insight good, Euthymic mood. Full Affect.  BP 120/78   Pulse 74   Temp 98.1 F (36.7 C)   Ht 5' 5"  (1.651 m)   Wt 168 lb (76.2 kg)   LMP 03/27/2021 Comment: irregular  SpO2 100%   BMI 27.96  kg/m  Wt Readings from Last 3 Encounters:  04/11/21 168 lb (76.2 kg)  04/10/21 170 lb (77.1 kg)  10/04/20 160 lb 1.6 oz (72.6 kg)     Health Maintenance Due  Topic Date Due   Hepatitis C Screening  Never done   COVID-19 Vaccine (3 - Booster for Pfizer series) 09/06/2020   PAP SMEAR-Modifier  09/12/2020   COLONOSCOPY (Pts 45-86yr Insurance coverage will need to be confirmed)  Never done    There are no preventive care reminders to display for this patient.  Lab Results  Component Value Date   TSH 0.985 10/04/2020   Lab Results  Component Value Date   WBC 4.4 10/04/2020   HGB 12.3 10/04/2020   HCT 37.5 10/04/2020   MCV 87 10/04/2020   PLT 272 10/04/2020   Lab Results  Component Value Date   NA 137 10/04/2020   K 4.2 10/04/2020   CO2 20 10/04/2020   GLUCOSE 76 10/04/2020   BUN 12 10/04/2020   CREATININE 0.82 10/04/2020   BILITOT 0.5 10/04/2020   ALKPHOS 32 (L) 10/04/2020   AST 16 10/04/2020   ALT 9 10/04/2020   PROT 6.7 10/04/2020   ALBUMIN 4.4 10/04/2020   CALCIUM 9.3 10/04/2020   ANIONGAP 11 03/21/2020   EGFR 90 10/04/2020   Lab Results  Component Value Date   CHOL 171 10/04/2020   Lab Results  Component Value Date   HDL 71 10/04/2020   Lab Results  Component Value Date   LDLCALC 87 10/04/2020   Lab Results  Component Value Date   TRIG 70 10/04/2020   Lab Results  Component Value Date   CHOLHDL 2.4 10/04/2020   Lab Results  Component Value Date   HGBA1C 5.5 10/04/2020      Assessment & Plan:   Problem List Items Addressed This Visit       Cardiovascular and Mediastinum   HTN, goal below 130/80 - Primary (Chronic)    -Stable. -Discussed with patient will continue Losartan 25 mg. If BP consistently >130/85 then recommend increasing Losartan. Patient verbalized understanding. Will discontinue HCTZ. -Discussed low sodium diet, adequate hydration and weight loss.       Relevant Orders   Comp Met (CMET)   Other Visit Diagnoses      Need for influenza vaccination       Relevant Orders   Flu Vaccine QUAD 64moM (Fluarix, Fluzone & Alfiuria Quad PF) (Completed)   Palpitations       Relevant Orders   TSH   CBC w/Diff   EKG 12-Lead  Palpitations: -EKG obtained: NSR, rate 69 bpm, no acute ST-T wave changes. Compared with EKG 03/21/2020, no significant changes noted. -Discussed with patient likely stress related. Will collect labs to r/o other etiologies such as thyroid disease, anemia or electrolyte imbalance.  -Will place order for Holter monitor for further evaluation.  No orders of the defined types were placed in this encounter.   Follow-up: Return in about 6 months (around 10/09/2021) for CPE and FBW.    Lorrene Reid, PA-C

## 2021-04-12 LAB — COMPREHENSIVE METABOLIC PANEL
ALT: 10 IU/L (ref 0–32)
AST: 16 IU/L (ref 0–40)
Albumin/Globulin Ratio: 2.2 (ref 1.2–2.2)
Albumin: 4.8 g/dL (ref 3.8–4.8)
Alkaline Phosphatase: 36 IU/L — ABNORMAL LOW (ref 44–121)
BUN/Creatinine Ratio: 13 (ref 9–23)
BUN: 9 mg/dL (ref 6–24)
Bilirubin Total: 0.2 mg/dL (ref 0.0–1.2)
CO2: 21 mmol/L (ref 20–29)
Calcium: 9.7 mg/dL (ref 8.7–10.2)
Chloride: 100 mmol/L (ref 96–106)
Creatinine, Ser: 0.71 mg/dL (ref 0.57–1.00)
Globulin, Total: 2.2 g/dL (ref 1.5–4.5)
Sodium: 139 mmol/L (ref 134–144)
Total Protein: 7 g/dL (ref 6.0–8.5)
eGFR: 107 mL/min/{1.73_m2} (ref >59)

## 2021-04-12 LAB — CBC WITH DIFFERENTIAL/PLATELET
Basophils Absolute: 0 10*3/uL (ref 0.0–0.2)
Basos: 1 %
EOS (ABSOLUTE): 0 10*3/uL (ref 0.0–0.4)
Eos: 0 %
Hematocrit: 32.9 % — ABNORMAL LOW (ref 34.0–46.6)
Hemoglobin: 11.1 g/dL (ref 11.1–15.9)
Immature Grans (Abs): 0 10*3/uL (ref 0.0–0.1)
Immature Granulocytes: 0 %
Lymphocytes Absolute: 2 10*3/uL (ref 0.7–3.1)
Lymphs: 32 %
MCH: 28.3 pg (ref 26.6–33.0)
MCHC: 33.7 g/dL (ref 31.5–35.7)
MCV: 84 fL (ref 79–97)
Monocytes Absolute: 0.4 10*3/uL (ref 0.1–0.9)
Monocytes: 7 %
Neutrophils Absolute: 3.7 10*3/uL (ref 1.4–7.0)
Neutrophils: 60 %
Platelets: 335 10*3/uL (ref 150–450)
RBC: 3.92 x10E6/uL (ref 3.77–5.28)
RDW: 13.8 % (ref 11.7–15.4)
WBC: 6.1 10*3/uL (ref 3.4–10.8)

## 2021-04-12 LAB — TSH: TSH: 0.886 u[IU]/mL (ref 0.450–4.500)

## 2021-04-16 ENCOUNTER — Ambulatory Visit (AMBULATORY_SURGERY_CENTER): Payer: BC Managed Care – PPO | Admitting: Gastroenterology

## 2021-04-16 ENCOUNTER — Encounter: Payer: Self-pay | Admitting: Gastroenterology

## 2021-04-16 ENCOUNTER — Other Ambulatory Visit: Payer: Self-pay

## 2021-04-16 VITALS — BP 139/87 | HR 69 | Temp 98.6°F | Resp 16 | Ht 65.0 in | Wt 168.0 lb

## 2021-04-16 DIAGNOSIS — Z1211 Encounter for screening for malignant neoplasm of colon: Secondary | ICD-10-CM

## 2021-04-16 MED ORDER — SODIUM CHLORIDE 0.9 % IV SOLN
500.0000 mL | Freq: Once | INTRAVENOUS | Status: AC
Start: 2021-04-16 — End: ?

## 2021-04-16 NOTE — Progress Notes (Signed)
[facility] History and Physical   Primary Care Physician:  Robin Masker, PA-C   Reason for Procedure:   Colon cancer screening  Plan:    Screening colonoscopy     HPI: Robin Barajas is a 45 y.o. female undergoing initial average risk screening colonoscopy.  She has no chronic GI symptoms and no family history of colon cancer.   Past Medical History:  Diagnosis Date   Allergy    Generalized headaches    Heart murmur    Hypertension 2003    Past Surgical History:  Procedure Laterality Date   APPENDECTOMY  1990   ELBOW FRACTURE SURGERY  1984   WISDOM TOOTH EXTRACTION      Prior to Admission medications   Medication Sig Start Date End Date Taking? Authorizing Provider  Cholecalciferol (VITAMIN D3) 5000 units TABS 5,000 IU OTC vitamin D3 daily. Patient taking differently: Take 5,000 Units by mouth daily. 5,000 IU OTC vitamin D3 daily. 06/17/17  Yes Barajas, Deborah, DO  hydrochlorothiazide (MICROZIDE) 12.5 MG capsule TAKE 1/2 TABLET BY MOUTH ONCE DAILY 02/13/21  Yes Barajas, Maritza, PA-C  losartan (COZAAR) 25 MG tablet TAKE 2 TABS BY MOUTH DAILY 02/13/21  Yes Barajas, Maritza, PA-C  acetaminophen (TYLENOL) 500 MG tablet Take 500 mg by mouth every 6 (six) hours as needed for mild pain or headache.    [provider]  ibuprofen (ADVIL) 200 MG tablet Take 200 mg by mouth every 6 (six) hours as needed for fever, headache or mild pain.    [provider]    Current Outpatient Medications  Medication Sig Dispense Refill   Cholecalciferol (VITAMIN D3) 5000 units TABS 5,000 IU OTC vitamin D3 daily. (Patient taking differently: Take 5,000 Units by mouth daily. 5,000 IU OTC vitamin D3 daily.) 90 tablet 3   hydrochlorothiazide (MICROZIDE) 12.5 MG capsule TAKE 1/2 TABLET BY MOUTH ONCE DAILY 15 capsule 2   losartan (COZAAR) 25 MG tablet TAKE 2 TABS BY MOUTH DAILY 60 tablet 2   acetaminophen (TYLENOL) 500 MG tablet Take 500 mg by mouth every 6 (six) hours  as needed for mild pain or headache.     ibuprofen (ADVIL) 200 MG tablet Take 200 mg by mouth every 6 (six) hours as needed for fever, headache or mild pain.     Current Facility-Administered Medications  Medication Dose Route Frequency Provider Last Rate Last Admin   0.9 %  sodium chloride infusion  500 mL Intravenous Once Robin Lucks, MD        Allergies as of 04/16/2021 - Review Complete 04/16/2021  Allergen Reaction Noted   Morphine and related Shortness Of Breath and Nausea And Vomiting 02/03/2017   Codeine Itching 02/03/2017   Turmeric Hives, Itching, and Rash 06/22/2018    Family History  Problem Relation Age of Onset   Depression Mother    Colon polyps Father    Depression Sister    Alcohol abuse Maternal Grandfather    Cancer Paternal Grandmother        ovarian   Diabetes Paternal Grandfather    Colon cancer Neg Hx    Esophageal cancer Neg Hx    Rectal cancer Neg Hx    Stomach cancer Neg Hx     Social History   Socioeconomic History   Marital status: Married    Spouse name: Not on file   Number of children: Not on file   Years of education: Not on file   Highest education level: Not on file  Occupational History  Not on file  Tobacco Use   Smoking status: Never   Smokeless tobacco: Never  Vaping Use   Vaping Use: Never used  Substance and Sexual Activity   Alcohol use: No   Drug use: No   Sexual activity: Yes  Other Topics Concern   Not on file  Social History Narrative   Not on file   Social Determinants of Health   Financial Resource Strain: Not on file  Food Insecurity: Not on file  Transportation Needs: Not on file  Physical Activity: Not on file  Stress: Not on file  Social Connections: Not on file  Intimate Partner Violence: Not on file    Review of Systems:  All other review of systems negative except as mentioned in the HPI.  Physical Exam: Vital signs BP 120/68   Pulse 88   Temp 98.6 F (37 C) (Skin)   Ht 5\' 5"   (1.651 m)   Wt 168 lb (76.2 kg)   LMP 03/27/2021 Comment: irregular  SpO2 100%   BMI 27.96 kg/m   General:   Alert,  Well-developed, well-nourished, pleasant and cooperative in NAD.  MP2 Lungs:  Clear throughout to auscultation.   Heart:  Regular rate and rhythm; no murmurs, clicks, rubs,  or gallops. Abdomen:  Soft, nontender and nondistended. Normal bowel sounds.   Neuro/Psych:  Normal mood and affect. A and O x 3  Robin Barajas E. 03/29/2021, MD [facility]

## 2021-04-16 NOTE — Progress Notes (Signed)
Pt stable to PACU report given  

## 2021-04-16 NOTE — Progress Notes (Signed)
Pt's states no medical or surgical changes since previsit or office visit. VS assessed by N.C ?

## 2021-04-16 NOTE — Op Note (Signed)
[facility] Patient Name: Robin Barajas Procedure Date: 04/16/2021 1:45 PM MRN: [id number] Endoscopist: Lorin Picket E. Tomasa Rand , MD Age: 45 Referring MD:  Date of Birth: Jan 03, 1976 Gender: Female Account #: [id number] Procedure:                Colonoscopy Indications:              Screening for colorectal malignant neoplasm, This                            is the patient's first colonoscopy Medicines:                Monitored Anesthesia Care Procedure:                Pre-Anesthesia Assessment:                           - Prior to the procedure, a History and Physical                            was performed, and patient medications and                            allergies were reviewed. The patient's tolerance of                            previous anesthesia was also reviewed. The risks                            and benefits of the procedure and the sedation                            options and risks were discussed with the patient.                            All questions were answered, and informed consent                            was obtained. Prior Anticoagulants: The patient has                            taken no previous anticoagulant or antiplatelet                            agents. ASA Grade Assessment: II - A patient with                            mild systemic disease. After reviewing the risks                            and benefits, the patient was deemed in                            satisfactory condition to undergo the procedure.  After obtaining informed consent, the colonoscope                            was passed under direct vision. Throughout the                            procedure, the patient's blood pressure, pulse, and                            oxygen saturations were monitored continuously. The                            CF [id number] #[id number] was introduced through the anus                            and advanced to the  the cecum, identified by                            appendiceal orifice and ileocecal valve. The                            colonoscopy was somewhat difficult due to a                            tortuous colon. Successful completion of the                            procedure was aided by using manual pressure. The                            patient tolerated the procedure well. The quality                            of the bowel preparation was good. The ileocecal                            valve, appendiceal orifice, and rectum were                            photographed. The bowel preparation used was                            MoviPrep via split dose instruction. Scope In: 2:29:13 PM Scope Out: 2:47:18 PM Scope Withdrawal Time: 0 hours 10 minutes 5 seconds  Total Procedure Duration: 0 hours 18 minutes 5 seconds  Findings:                 The perianal and digital rectal examinations were                            normal. Pertinent negatives include normal                            sphincter tone and no palpable rectal lesions.  A few small and large-mouthed diverticula were                            found in the transverse colon, ascending colon and                            cecum.                           The exam was otherwise normal throughout the                            examined colon.                           The retroflexed view of the distal rectum and anal                            verge was normal and showed no anal or rectal                            abnormalities. Complications:            No immediate complications. Estimated Blood Loss:     Estimated blood loss: none. Impression:               - Diverticulosis in the transverse colon, in the                            ascending colon and in the cecum.                           - The distal rectum and anal verge are normal on                            retroflexion view.                            - No specimens collected. Recommendation:           - Patient has a contact number available for                            emergencies. The signs and symptoms of potential                            delayed complications were discussed with the                            patient. Return to normal activities tomorrow.                            Written discharge instructions were provided to the                            patient.                           -  Resume previous diet.                           - Continue present medications.                           - Repeat colonoscopy in 10 years for screening                            purposes. Kamala Kolton E. Tomasa Rand, MD 04/16/2021 2:55:16 PM This report has been signed electronically.

## 2021-04-16 NOTE — Patient Instructions (Signed)
Handout on diverticulosis given to patient.  No repeat colonoscopy for 10 years for surveillance!!   YOU HAD AN ENDOSCOPIC PROCEDURE TODAY AT THE [facility]:   Refer to the procedure report that was given to you for any specific questions about what was found during the examination.  If the procedure report does not answer your questions, please call your gastroenterologist to clarify.  If you requested that your care partner not be given the details of your procedure findings, then the procedure report has been included in a sealed envelope for you to review at your convenience later.  YOU SHOULD EXPECT: Some feelings of bloating in the abdomen. Passage of more gas than usual.  Walking can help get rid of the air that was put into your GI tract during the procedure and reduce the bloating. If you had a lower endoscopy (such as a colonoscopy or flexible sigmoidoscopy) you may notice spotting of blood in your stool or on the toilet paper. If you underwent a bowel prep for your procedure, you may not have a normal bowel movement for a few days.  Please Note:  You might notice some irritation and congestion in your nose or some drainage.  This is from the oxygen used during your procedure.  There is no need for concern and it should clear up in a day or so.  SYMPTOMS TO REPORT IMMEDIATELY:  Following lower endoscopy (colonoscopy or flexible sigmoidoscopy):  Excessive amounts of blood in the stool  Significant tenderness or worsening of abdominal pains  Swelling of the abdomen that is new, acute  Fever of 100F or higher  For urgent or emergent issues, a gastroenterologist can be reached at any hour by calling [phone]. Do not use MyChart messaging for urgent concerns.    DIET:  We do recommend a small meal at first, but then you may proceed to your regular diet.  Drink plenty of fluids but you should avoid alcoholic beverages for 24 hours.  ACTIVITY:  You should plan to take  it easy for the rest of today and you should NOT DRIVE or use heavy machinery until tomorrow (because of the sedation medicines used during the test).    FOLLOW UP: Our staff will call the number listed on your records 48-72 hours following your procedure to check on you and address any questions or concerns that you may have regarding the information given to you following your procedure. If we do not reach you, we will leave a message.  We will attempt to reach you two times.  During this call, we will ask if you have developed any symptoms of COVID 19. If you develop any symptoms (ie: fever, flu-like symptoms, shortness of breath, cough etc.) before then, please call [phone].  If you test positive for Covid 19 in the 2 weeks post procedure, please call and report this information to [place].    If any biopsies were taken you will be contacted by phone or by letter within the next 1-3 weeks.  Please call us at [phone] if you have not heard about the biopsies in 3 weeks.    SIGNATURES/CONFIDENTIALITY: You and/or your care partner have signed paperwork which will be entered into your electronic medical record.  These signatures attest to the fact that that the information above on your After Visit Summary has been reviewed and is understood.  Full responsibility of the confidentiality of this discharge information lies with you and/or your care-partner.

## 2021-04-17 ENCOUNTER — Encounter: Payer: Self-pay | Admitting: Physician Assistant

## 2021-04-17 DIAGNOSIS — R002 Palpitations: Secondary | ICD-10-CM | POA: Diagnosis not present

## 2021-04-18 ENCOUNTER — Telehealth: Payer: Self-pay | Admitting: *Deleted

## 2021-04-18 NOTE — Telephone Encounter (Signed)
  Follow up Call-  Call back number 04/16/2021  Post procedure Call Back phone  # [phone]  Permission to leave phone message Yes  Some recent data might be hidden    LMOM to call back with any questions or concerns.  Also, call back if patient has developed fever, respiratory issues or been dx with COVID or had any family members or close contacts diagnosed since her procedure.

## 2021-04-18 NOTE — Telephone Encounter (Signed)
Attempted to call patient for their post-procedure follow-up call. No answer. Left voicemail.   

## 2021-05-21 ENCOUNTER — Other Ambulatory Visit: Payer: Self-pay | Admitting: Physician Assistant

## 2021-05-21 DIAGNOSIS — I10 Essential (primary) hypertension: Secondary | ICD-10-CM

## 2021-07-11 ENCOUNTER — Other Ambulatory Visit: Payer: Self-pay | Admitting: Physician Assistant

## 2021-07-11 DIAGNOSIS — I10 Essential (primary) hypertension: Secondary | ICD-10-CM

## 2021-08-28 NOTE — Progress Notes (Signed)
error 

## 2021-09-20 ENCOUNTER — Other Ambulatory Visit: Payer: Self-pay | Admitting: Physician Assistant

## 2021-09-20 DIAGNOSIS — I10 Essential (primary) hypertension: Secondary | ICD-10-CM

## 2021-10-04 ENCOUNTER — Other Ambulatory Visit: Payer: Self-pay | Admitting: Physician Assistant

## 2021-10-04 DIAGNOSIS — I10 Essential (primary) hypertension: Secondary | ICD-10-CM

## 2021-10-15 ENCOUNTER — Encounter: Payer: Self-pay | Admitting: Physician Assistant

## 2021-12-04 ENCOUNTER — Encounter: Payer: Self-pay | Admitting: Physician Assistant

## 2021-12-31 IMAGING — CR DG CHEST 2V
2 series · 2 of 2 positions shown · non-contrast
Comparison: None.

CLINICAL DATA: Chest pain

EXAM:
CHEST - 2 VIEW

[chest pa]
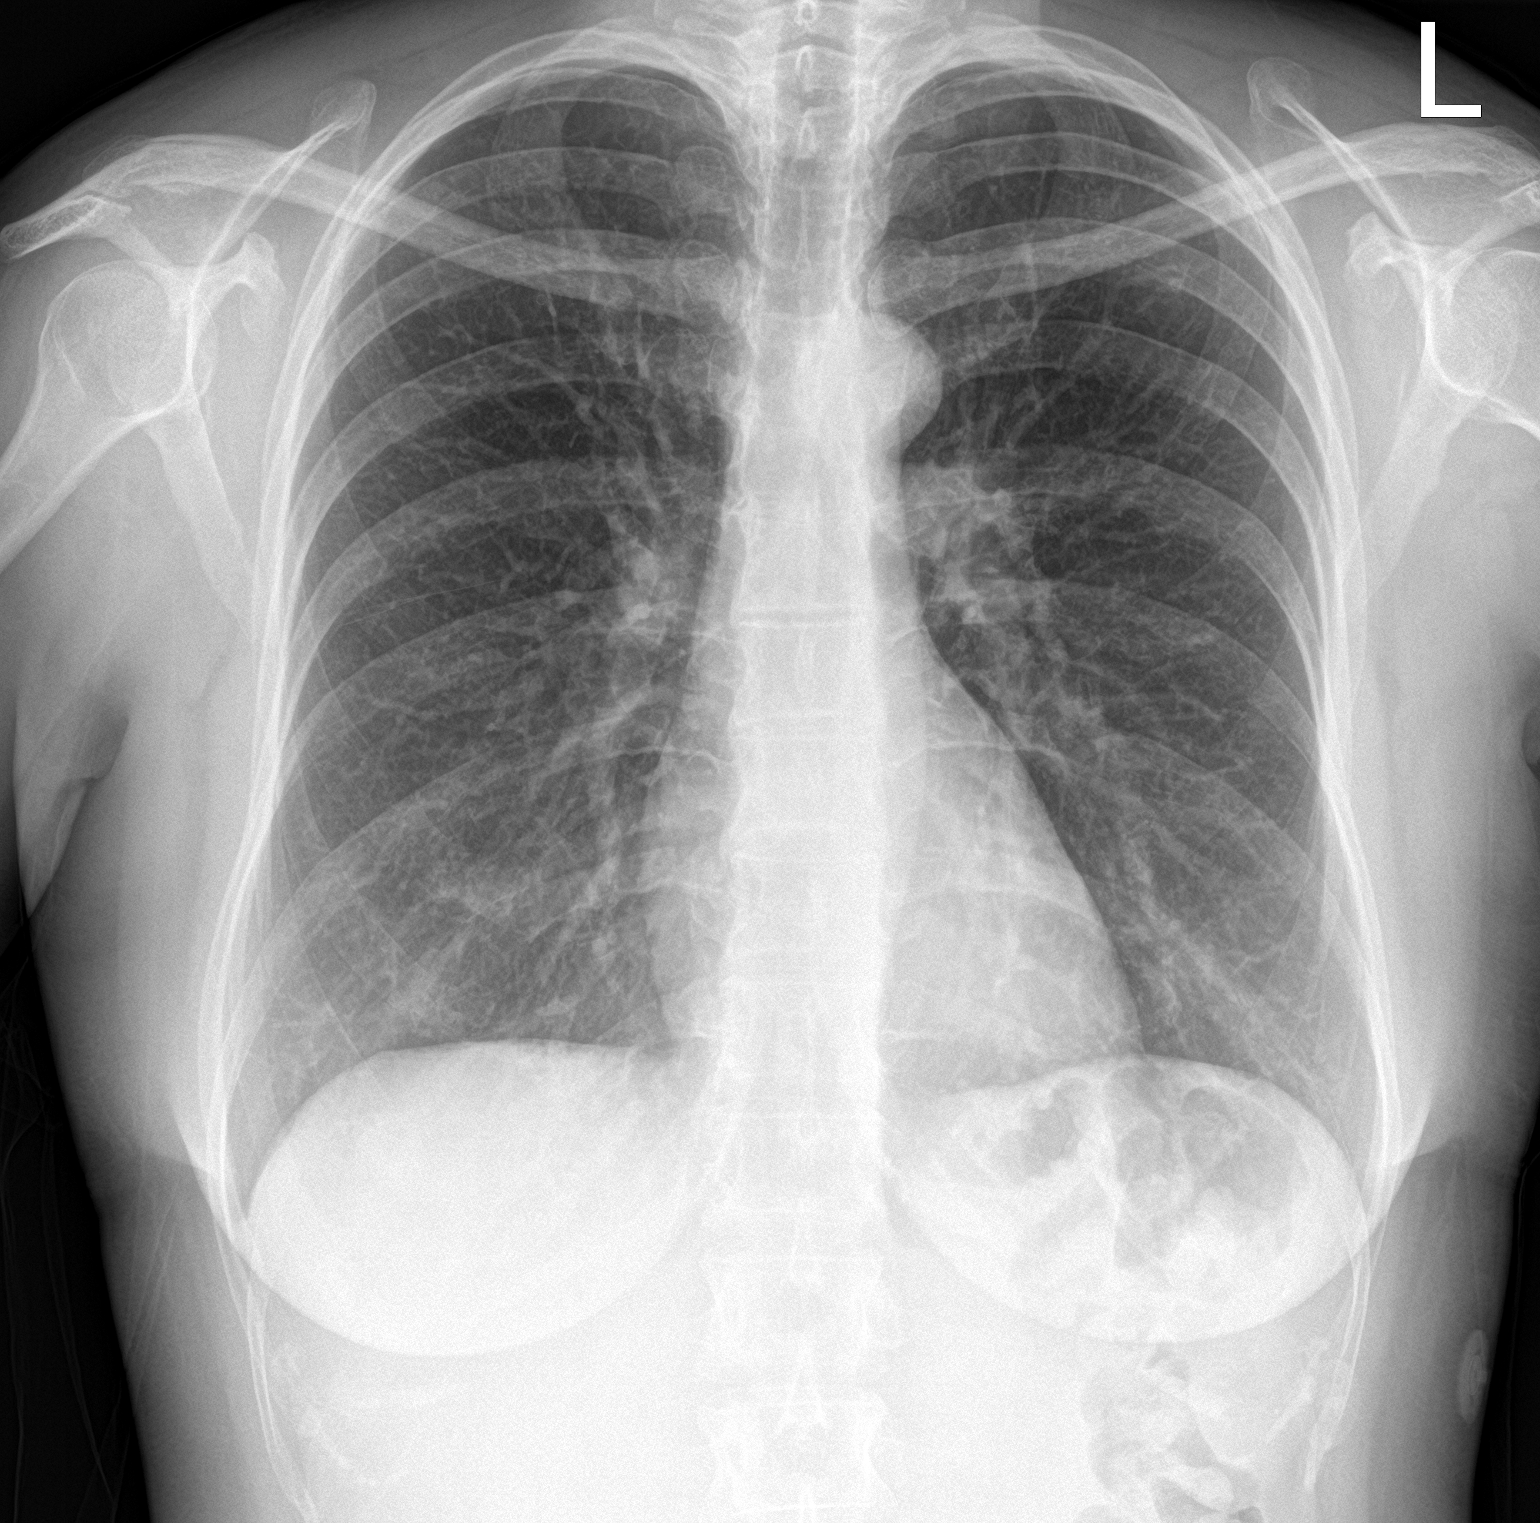

[chest lat]
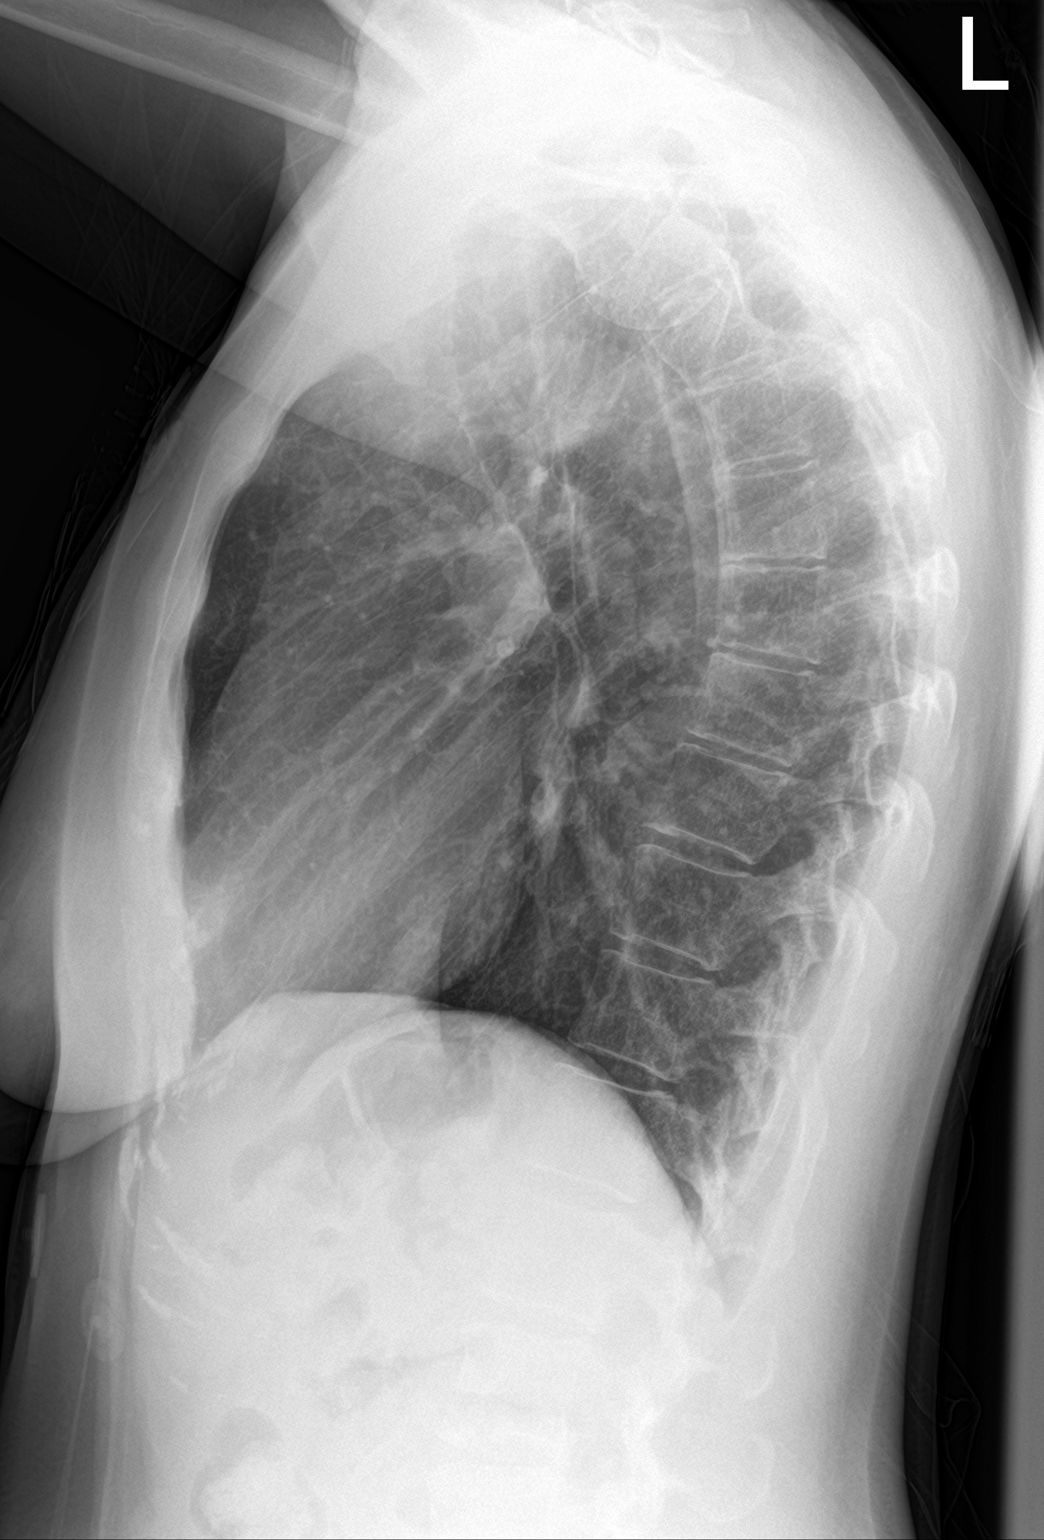

[2 of 2 positions shown; findings below may reference images not displayed]

FINDINGS: The heart size and mediastinal contours are within normal limits.
Both lungs are clear. The visualized skeletal structures are
unremarkable.
IMPRESSION: No active cardiopulmonary disease.

## 2022-02-12 ENCOUNTER — Ambulatory Visit (INDEPENDENT_AMBULATORY_CARE_PROVIDER_SITE_OTHER): Payer: BC Managed Care – PPO | Admitting: Physician Assistant

## 2022-02-12 DIAGNOSIS — Z111 Encounter for screening for respiratory tuberculosis: Secondary | ICD-10-CM

## 2022-02-12 NOTE — Progress Notes (Signed)
HPI:  Patient is here for PPD placement.  A&P:  Patient tolerated injection well without complications.  [name], CMA  Agree with above. MA, PA-C

## 2022-02-13 NOTE — Patient Instructions (Signed)
Hypertension, Adult ?Hypertension is another name for high blood pressure. High blood pressure forces your heart to work harder to pump blood. This can cause problems over time. ?There are two numbers in a blood pressure reading. There is a top number (systolic) over a bottom number (diastolic). It is best to have a blood pressure that is below 120/80. ?What are the causes? ?The cause of this condition is not known. Some other conditions can lead to high blood pressure. ?What increases the risk? ?Some lifestyle factors can make you more likely to develop high blood pressure: ?Smoking. ?Not getting enough exercise or physical activity. ?Being overweight. ?Having too much fat, sugar, calories, or salt (sodium) in your diet. ?Drinking too much alcohol. ?Other risk factors include: ?Having any of these conditions: ?Heart disease. ?Diabetes. ?High cholesterol. ?Kidney disease. ?Obstructive sleep apnea. ?Having a family history of high blood pressure and high cholesterol. ?Age. The risk increases with age. ?Stress. ?What are the signs or symptoms? ?High blood pressure may not cause symptoms. Very high blood pressure (hypertensive crisis) may cause: ?Headache. ?Fast or uneven heartbeats (palpitations). ?Shortness of breath. ?Nosebleed. ?Vomiting or feeling like you may vomit (nauseous). ?Changes in how you see. ?Very bad chest pain. ?Feeling dizzy. ?Seizures. ?How is this treated? ?This condition is treated by making healthy lifestyle changes, such as: ?Eating healthy foods. ?Exercising more. ?Drinking less alcohol. ?Your doctor may prescribe medicine if lifestyle changes do not help enough and if: ?Your top number is above 130. ?Your bottom number is above 80. ?Your personal target blood pressure may vary. ?Follow these instructions at home: ?Eating and drinking ? ?If told, follow the DASH eating plan. To follow this plan: ?Fill one half of your plate at each meal with fruits and vegetables. ?Fill one fourth of your plate  at each meal with whole grains. Whole grains include whole-wheat pasta, brown rice, and whole-grain bread. ?Eat or drink low-fat dairy products, such as skim milk or low-fat yogurt. ?Fill one fourth of your plate at each meal with low-fat (lean) proteins. Low-fat proteins include fish, chicken without skin, eggs, beans, and tofu. ?Avoid fatty meat, cured and processed meat, or chicken with skin. ?Avoid pre-made or processed food. ?Limit the amount of salt in your diet to less than 1,500 mg each day. ?Do not drink alcohol if: ?Your doctor tells you not to drink. ?You are pregnant, may be pregnant, or are planning to become pregnant. ?If you drink alcohol: ?Limit how much you have to: ?0-1 drink a day for women. ?0-2 drinks a day for men. ?Know how much alcohol is in your drink. In the [place] one drink equals one 12 oz bottle of beer (355 mL), one 5 oz glass of wine (148 mL), or one 1? oz glass of hard liquor (44 mL). ?Lifestyle ? ?Work with your doctor to stay at a healthy weight or to lose weight. Ask your doctor what the best weight is for you. ?Get at least 30 minutes of exercise that causes your heart to beat faster (aerobic exercise) most days of the week. This may include walking, swimming, or biking. ?Get at least 30 minutes of exercise that strengthens your muscles (resistance exercise) at least 3 days a week. This may include lifting weights or doing Pilates. ?Do not smoke or use any products that contain nicotine or tobacco. If you need help quitting, ask your doctor. ?Check your blood pressure at home as told by your doctor. ?Keep all follow-up visits. ?Medicines ?Take over-the-counter and prescription medicines   only as told by your doctor. Follow directions carefully. ?Do not skip doses of blood pressure medicine. The medicine does not work as well if you skip doses. Skipping doses also puts you at risk for problems. ?Ask your doctor about side effects or reactions to medicines that you should watch  for. ?Contact a doctor if: ?You think you are having a reaction to the medicine you are taking. ?You have headaches that keep coming back. ?You feel dizzy. ?You have swelling in your ankles. ?You have trouble with your vision. ?Get help right away if: ?You get a very bad headache. ?You start to feel mixed up (confused). ?You feel weak or numb. ?You feel faint. ?You have very bad pain in your: ?Chest. ?Belly (abdomen). ?You vomit more than once. ?You have trouble breathing. ?These symptoms may be an emergency. Get help right away. Call 911. ?Do not wait to see if the symptoms will go away. ?Do not drive yourself to the hospital. ?Summary ?Hypertension is another name for high blood pressure. ?High blood pressure forces your heart to work harder to pump blood. ?For most people, a normal blood pressure is less than 120/80. ?Making healthy choices can help lower blood pressure. If your blood pressure does not get lower with healthy choices, you may need to take medicine. ?This information is not intended to replace advice given to you by your health care provider. Make sure you discuss any questions you have with your health care provider. ?Document Revised: 05/03/2021 Document Reviewed: 05/03/2021 ?Elsevier Patient Education ? 2023 Elsevier Inc. ? ?

## 2022-02-14 ENCOUNTER — Encounter: Payer: Self-pay | Admitting: Physician Assistant

## 2022-02-14 ENCOUNTER — Ambulatory Visit (INDEPENDENT_AMBULATORY_CARE_PROVIDER_SITE_OTHER): Payer: BC Managed Care – PPO | Admitting: Physician Assistant

## 2022-02-14 VITALS — BP 113/75 | HR 82 | Temp 97.7°F | Ht 65.0 in | Wt 176.0 lb

## 2022-02-14 DIAGNOSIS — I10 Essential (primary) hypertension: Secondary | ICD-10-CM | POA: Diagnosis not present

## 2022-02-14 LAB — TB SKIN TEST
Induration: 0 mm
TB Skin Test: NEGATIVE

## 2022-02-14 NOTE — Progress Notes (Signed)
Established patient visit   Patient: Robin Barajas   DOB: [birth year]   46 y.o. Female  MRN: [id number] Visit Date: 02/14/2022  Chief Complaint  Patient presents with   Follow-up   Subjective    HPI  Patient presents for follow-up on hypertension. Patient has no acute concerns.  HTN: Pt denies chest pain, palpitations, dizziness or syncope. States has palpitations that come and go, no changes from baseline.. Taking Losartan as directed without side effects. States has been without HCTZ for 3-4 months. Has not checked BP at home lately.      Medications: Outpatient Medications Prior to Visit  Medication Sig   acetaminophen (TYLENOL) 500 MG tablet Take 500 mg by mouth every 6 (six) hours as needed for mild pain or headache.   Cholecalciferol (VITAMIN D3) 5000 units TABS 5,000 IU OTC vitamin D3 daily. (Patient taking differently: Take 5,000 Units by mouth daily. 5,000 IU OTC vitamin D3 daily.)   ibuprofen (ADVIL) 200 MG tablet Take 200 mg by mouth every 6 (six) hours as needed for fever, headache or mild pain.   losartan (COZAAR) 25 MG tablet TAKE 2 TABLETS BY MOUTH EVERY DAY   [DISCONTINUED] hydrochlorothiazide (MICROZIDE) 12.5 MG capsule TAKE 1/2 TABLET BY MOUTH EVERY DAY   Facility-Administered Medications Prior to Visit  Medication Dose Route Frequency Provider   0.9 %  sodium chloride infusion  500 mL Intravenous Once Daryel November, MD    Review of Systems Review of Systems:  A fourteen system review of systems was performed and found to be positive as per HPI.  Last CBC Lab Results  Component Value Date   WBC 6.1 04/11/2021   HGB 11.1 04/11/2021   HCT 32.9 (L) 04/11/2021   MCV 84 04/11/2021   MCH 28.3 04/11/2021   RDW 13.8 04/11/2021   PLT 335 65/78/4696   Last metabolic panel Lab Results  Component Value Date   GLUCOSE CANCELED 04/11/2021   NA 139 04/11/2021   K CANCELED 04/11/2021   CL 100 04/11/2021   CO2 21 04/11/2021   BUN 9 04/11/2021    CREATININE 0.71 04/11/2021   EGFR 107 04/11/2021   CALCIUM 9.7 04/11/2021   PROT 7.0 04/11/2021   ALBUMIN 4.8 04/11/2021   LABGLOB 2.2 04/11/2021   AGRATIO 2.2 04/11/2021   BILITOT 0.2 04/11/2021   ALKPHOS 36 (L) 04/11/2021   AST 16 04/11/2021   ALT 10 04/11/2021   ANIONGAP 11 03/21/2020   Last lipids Lab Results  Component Value Date   CHOL 171 10/04/2020   HDL 71 10/04/2020   LDLCALC 87 10/04/2020   TRIG 70 10/04/2020   CHOLHDL 2.4 10/04/2020   Last hemoglobin A1c Lab Results  Component Value Date   HGBA1C 5.5 10/04/2020   Last thyroid functions Lab Results  Component Value Date   TSH 0.886 04/11/2021     Objective    BP 113/75   Pulse 82   Temp 97.7 F (36.5 C)   Ht 5' 5"  (1.651 m)   Wt 176 lb (79.8 kg)   LMP  (LMP Unknown)   SpO2 100%   BMI 29.29 kg/m  BP Readings from Last 3 Encounters:  02/14/22 113/75  04/16/21 139/87  04/11/21 120/78   Wt Readings from Last 3 Encounters:  02/14/22 176 lb (79.8 kg)  04/16/21 168 lb (76.2 kg)  04/11/21 168 lb (76.2 kg)    Physical Exam  General:  Well Developed, well nourished, appropriate for stated age.  Neuro:  Alert and oriented,  extra-ocular muscles intact  HEENT:  Normocephalic, atraumatic, neck supple  Skin:  no gross rash, warm, pink. Cardiac:  RRR, S1 S2 Respiratory: CTA B/L w/o wheezing, crackles or rales. Vascular:  Ext warm, no cyanosis apprec.; cap RF less 2 sec. Psych:  No HI/SI, judgement and insight good, Euthymic mood. Full Affect.   No results found for any visits on 02/14/22.  Assessment & Plan      Problem List Items Addressed This Visit       Cardiovascular and Mediastinum   HTN, goal below 130/80 - Primary (Chronic)    -BP at goal so will continue with Losartan 25 mg and discontinue HCTZ. Recommend to monitor BP at home especially if not feeling well. Will continue to monitor. Patient is due for annual physical and labs, advised to schedule.       Return in about 3 months  (around 05/17/2022) for CPE and FBW.        Lorrene Reid, PA-C  [facility] [phone] (phone) [phone] (fax)  [place]

## 2022-02-14 NOTE — Assessment & Plan Note (Signed)
-  BP at goal so will continue with Losartan 25 mg and discontinue HCTZ. Recommend to monitor BP at home especially if not feeling well. Will continue to monitor. Patient is due for annual physical and labs, advised to schedule.

## 2022-05-05 ENCOUNTER — Other Ambulatory Visit: Payer: Self-pay | Admitting: Nurse Practitioner

## 2022-05-05 DIAGNOSIS — I10 Essential (primary) hypertension: Secondary | ICD-10-CM

## 2022-05-29 ENCOUNTER — Ambulatory Visit (INDEPENDENT_AMBULATORY_CARE_PROVIDER_SITE_OTHER): Payer: BC Managed Care – PPO | Admitting: Physician Assistant

## 2022-05-29 ENCOUNTER — Encounter: Payer: Self-pay | Admitting: Physician Assistant

## 2022-05-29 VITALS — BP 122/81 | HR 79 | Temp 97.7°F | Resp 18 | Ht 65.0 in | Wt 170.0 lb

## 2022-05-29 DIAGNOSIS — Z23 Encounter for immunization: Secondary | ICD-10-CM

## 2022-05-29 DIAGNOSIS — Z0000 Encounter for general adult medical examination without abnormal findings: Secondary | ICD-10-CM | POA: Diagnosis not present

## 2022-05-29 DIAGNOSIS — E559 Vitamin D deficiency, unspecified: Secondary | ICD-10-CM | POA: Diagnosis not present

## 2022-05-29 DIAGNOSIS — E663 Overweight: Secondary | ICD-10-CM

## 2022-05-29 DIAGNOSIS — Z6828 Body mass index (BMI) 28.0-28.9, adult: Secondary | ICD-10-CM

## 2022-05-29 DIAGNOSIS — I10 Essential (primary) hypertension: Secondary | ICD-10-CM | POA: Diagnosis not present

## 2022-05-29 NOTE — Patient Instructions (Signed)
Preventive Care 46-46 Years Old, Female Preventive care refers to lifestyle choices and visits with your health care provider that can promote health and wellness. Preventive care visits are also called wellness exams. What can I expect for my preventive care visit? Counseling Your health care provider may ask you questions about your: Medical history, including: Past medical problems. Family medical history. Pregnancy history. Current health, including: Menstrual cycle. Method of birth control. Emotional well-being. Home life and relationship well-being. Sexual activity and sexual health. Lifestyle, including: Alcohol, nicotine or tobacco, and drug use. Access to firearms. Diet, exercise, and sleep habits. Work and work environment. Sunscreen use. Safety issues such as seatbelt and bike helmet use. Physical exam Your health care provider will check your: Height and weight. These may be used to calculate your BMI (body mass index). BMI is a measurement that tells if you are at a healthy weight. Waist circumference. This measures the distance around your waistline. This measurement also tells if you are at a healthy weight and may help predict your risk of certain diseases, such as type 2 diabetes and high blood pressure. Heart rate and blood pressure. Body temperature. Skin for abnormal spots. What immunizations do I need?  Vaccines are usually given at various ages, according to a schedule. Your health care provider will recommend vaccines for you based on your age, medical history, and lifestyle or other factors, such as travel or where you work. What tests do I need? Screening Your health care provider may recommend screening tests for certain conditions. This may include: Lipid and cholesterol levels. Diabetes screening. This is done by checking your blood sugar (glucose) after you have not eaten for a while (fasting). Pelvic exam and Pap test. Hepatitis B test. Hepatitis C  test. HIV (human immunodeficiency virus) test. STI (sexually transmitted infection) testing, if you are at risk. Lung cancer screening. Colorectal cancer screening. Mammogram. Talk with your health care provider about when you should start having regular mammograms. This may depend on whether you have a family history of breast cancer. BRCA-related cancer screening. This may be done if you have a family history of breast, ovarian, tubal, or peritoneal cancers. Bone density scan. This is done to screen for osteoporosis. Talk with your health care provider about your test results, treatment options, and if necessary, the need for more tests. Follow these instructions at home: Eating and drinking  Eat a diet that includes fresh fruits and vegetables, whole grains, lean protein, and low-fat dairy products. Take vitamin and mineral supplements as recommended by your health care provider. Do not drink alcohol if: Your health care provider tells you not to drink. You are pregnant, may be pregnant, or are planning to become pregnant. If you drink alcohol: Limit how much you have to 0-1 drink a day. Know how much alcohol is in your drink. In the [place] one drink equals one 12 oz bottle of beer (355 mL), one 5 oz glass of wine (148 mL), or one 1 oz glass of hard liquor (44 mL). Lifestyle Brush your teeth every morning and night with fluoride toothpaste. Floss one time each day. Exercise for at least 30 minutes 5 or more days each week. Do not use any products that contain nicotine or tobacco. These products include cigarettes, chewing tobacco, and vaping devices, such as e-cigarettes. If you need help quitting, ask your health care provider. Do not use drugs. If you are sexually active, practice safe sex. Use a condom or other form of protection to   prevent STIs. If you do not wish to become pregnant, use a form of birth control. If you plan to become pregnant, see your health care provider for a  prepregnancy visit. Take aspirin only as told by your health care provider. Make sure that you understand how much to take and what form to take. Work with your health care provider to find out whether it is safe and beneficial for you to take aspirin daily. Find healthy ways to manage stress, such as: Meditation, yoga, or listening to music. Journaling. Talking to a trusted person. Spending time with friends and family. Minimize exposure to UV radiation to reduce your risk of skin cancer. Safety Always wear your seat belt while driving or riding in a vehicle. Do not drive: If you have been drinking alcohol. Do not ride with someone who has been drinking. When you are tired or distracted. While texting. If you have been using any mind-altering substances or drugs. Wear a helmet and other protective equipment during sports activities. If you have firearms in your house, make sure you follow all gun safety procedures. Seek help if you have been physically or sexually abused. What's next? Visit your health care provider once a year for an annual wellness visit. Ask your health care provider how often you should have your eyes and teeth checked. Stay up to date on all vaccines. This information is not intended to replace advice given to you by your health care provider. Make sure you discuss any questions you have with your health care provider. Document Revised: 01/10/2021 Document Reviewed: 01/10/2021 Elsevier Patient Education  [place].

## 2022-05-29 NOTE — Progress Notes (Signed)
Complete physical exam   Patient: Robin Barajas   DOB: 14-Oct-1975   46 y.o. Female  MRN: [id number] Visit Date: 05/29/2022   Chief Complaint  Patient presents with   Annual Exam   Subjective    Robin Barajas is a 46 y.o. female who presents today for a complete physical exam.  She reports consuming a  lower carb and fat  diet.  Walking daily.  She generally feels fairly well. She does not have additional problems to discuss today.     Past Medical History:  Diagnosis Date   Allergy    Generalized headaches    Heart murmur    Hypertension 2003   Past Surgical History:  Procedure Laterality Date   APPENDECTOMY  1990   ELBOW FRACTURE SURGERY  1984   WISDOM TOOTH EXTRACTION     Social History   Socioeconomic History   Marital status: Married    Spouse name: Not on file   Number of children: Not on file   Years of education: Not on file   Highest education level: Not on file  Occupational History   Not on file  Tobacco Use   Smoking status: Never   Smokeless tobacco: Never  Vaping Use   Vaping Use: Never used  Substance and Sexual Activity   Alcohol use: No   Drug use: No   Sexual activity: Yes  Other Topics Concern   Not on file  Social History Narrative   Not on file   Social Determinants of Health   Financial Resource Strain: Not on file  Food Insecurity: Not on file  Transportation Needs: Not on file  Physical Activity: Not on file  Stress: Not on file  Social Connections: Not on file  Intimate Partner Violence: Not on file     Medications: Outpatient Medications Prior to Visit  Medication Sig   acetaminophen (TYLENOL) 500 MG tablet Take 500 mg by mouth every 6 (six) hours as needed for mild pain or headache.   Cholecalciferol (VITAMIN D3) 5000 units TABS 5,000 IU OTC vitamin D3 daily. (Patient taking differently: Take 5,000 Units by mouth daily. 5,000 IU OTC vitamin D3 daily.)   ibuprofen (ADVIL) 200 MG tablet Take 200 mg by mouth every 6  (six) hours as needed for fever, headache or mild pain.   losartan (COZAAR) 25 MG tablet TAKE 2 TABLETS BY MOUTH EVERY DAY   Facility-Administered Medications Prior to Visit  Medication Dose Route Frequency Provider   0.9 %  sodium chloride infusion  500 mL Intravenous Once Daryel November, MD    Review of Systems Review of Systems:  A fourteen system review of systems was performed and found to be positive as per HPI.  Last CBC Lab Results  Component Value Date   WBC 6.1 04/11/2021   HGB 11.1 04/11/2021   HCT 32.9 (L) 04/11/2021   MCV 84 04/11/2021   MCH 28.3 04/11/2021   RDW 13.8 04/11/2021   PLT 335 02/77/4128   Last metabolic panel Lab Results  Component Value Date   GLUCOSE CANCELED 04/11/2021   NA 139 04/11/2021   K CANCELED 04/11/2021   CL 100 04/11/2021   CO2 21 04/11/2021   BUN 9 04/11/2021   CREATININE 0.71 04/11/2021   EGFR 107 04/11/2021   CALCIUM 9.7 04/11/2021   PROT 7.0 04/11/2021   ALBUMIN 4.8 04/11/2021   LABGLOB 2.2 04/11/2021   AGRATIO 2.2 04/11/2021   BILITOT 0.2 04/11/2021   ALKPHOS 36 (L) 04/11/2021  AST 16 04/11/2021   ALT 10 04/11/2021   ANIONGAP 11 03/21/2020   Last lipids Lab Results  Component Value Date   CHOL 171 10/04/2020   HDL 71 10/04/2020   LDLCALC 87 10/04/2020   TRIG 70 10/04/2020   CHOLHDL 2.4 10/04/2020   Last hemoglobin A1c Lab Results  Component Value Date   HGBA1C 5.5 10/04/2020   Last thyroid functions Lab Results  Component Value Date   TSH 0.886 04/11/2021   Last vitamin D Lab Results  Component Value Date   VD25OH 46.6 10/04/2020    Objective    BP 122/81 (BP Location: Left Arm, Patient Position: Sitting, Cuff Size: Normal)   Pulse 79   Temp 97.7 F (36.5 C) (Oral)   Resp 18   Ht _0  (1.651 m)   Wt 170 lb (77.1 kg)   LMP 05/07/2022   SpO2 100%   BMI 28.29 kg/m  BP Readings from Last 3 Encounters:  05/29/22 122/81  02/14/22 113/75  04/16/21 139/87   Wt Readings from Last 3  Encounters:  05/29/22 170 lb (77.1 kg)  02/14/22 176 lb (79.8 kg)  04/16/21 168 lb (76.2 kg)    Physical Exam   General Appearance:       Alert, cooperative, in no acute distress, appears stated age   Head:    Normocephalic, without obvious abnormality, atraumatic  Eyes:    PERRL, conjunctiva/corneas clear, EOM's intact, fundi    benign, both eyes  Ears:    Normal TM's and external ear canals, both ears  Nose:   Nares normal, septum midline, mucosa normal, no drainage    or sinus tenderness  Throat:   Lips, mucosa, and tongue normal; teeth and gums normal  Neck:   Supple, symmetrical, trachea midline, no adenopathy;    thyroid:  no enlargement/tenderness/nodules; no JVD  Back:     Symmetric, no curvature, ROM normal, no CVA tenderness  Lungs:     Clear to auscultation bilaterally, respirations unlabored  Chest Wall:    No tenderness or deformity   Heart:    Normal heart rate. Normal rhythm. No murmurs, rubs, or gallops.   Breast Exam:    deferred  Abdomen:     Soft, non-tender, bowel sounds active all four quadrants,    no masses, no organomegaly  Pelvic:    deferred  Extremities:   All extremities are intact. No cyanosis or edema  Pulses:   2+ and symmetric all extremities  Skin:   Skin color, texture, turgor normal, no rashes or suspicious lesions  Lymph nodes:   Cervical, supraclavicular nodes normal  Neurologic:   CNII-XII grossly intact.     Last depression screening scores    05/29/2022    8:58 AM 02/14/2022    2:13 PM 04/11/2021    4:04 PM  PHQ 2/9 Scores  PHQ - 2 Score 2 0 0  PHQ- 9 Score _1 Last fall risk screening    05/29/2022    9:05 AM  Fall Risk   Falls in the past year? 1  Number falls in past yr: 1  Injury with Fall? 0     No results found for any visits on 05/29/22.  Assessment & Plan    Routine Health Maintenance and Physical Exam  Exercise Activities and Dietary recommendations -Discussed heart healthy diet low in fat and  carbohydrates. Recommend moderate exercise 150 mins/wk.  Immunization History  Administered Date(s) Administered   Influenza,inj,Quad PF,6+ Mos 04/11/2021  PFIZER(Purple Top)SARS-COV-2 Vaccination 03/16/2020, 04/06/2020   PPD Test 02/12/2022   Tdap 03/12/2018    Health Maintenance  Topic Date Due   Hepatitis C Screening  Never done   COVID-19 Vaccine (3 - Pfizer series) 06/01/2020   PAP SMEAR-Modifier  09/12/2020   INFLUENZA VACCINE  02/26/2022   TETANUS/TDAP  03/12/2028   COLONOSCOPY (Pts 45-31yr Insurance coverage will need to be confirmed)  04/17/2031   HIV Screening  Completed   HPV VACCINES  Aged Out    Discussed health benefits of physical activity, and encouraged her to engage in regular exercise appropriate for her age and condition.  Problem List Items Addressed This Visit       Cardiovascular and Mediastinum   HTN, goal below 130/80 (Chronic)   Relevant Orders   Lipid panel   CBC with Differential/Platelet   Comprehensive metabolic panel     Other   Vitamin D insufficiency (Chronic)   Relevant Orders   Vitamin D (25 hydroxy)   Other Visit Diagnoses     Healthcare maintenance    -  Primary   Relevant Orders   Hemoglobin A1c   Lipid panel   TSH   CBC with Differential/Platelet   Comprehensive metabolic panel   Vitamin D (25 hydroxy)   Need for influenza vaccination       Relevant Orders   Flu Vaccine QUAD 6+ mos PF IM (Fluarix Quad PF)   Overweight with body mass index (BMI) of 28 to 28.9 in adult          Will obtain routine fasting labs. Will request most recent pap report from [facility]. Pt agreeable to flu vaccine. BP controlled. Continue Losartan 25 mg daily. Continue weight loss efforts with diet and lifestyle changes. Follow-up in 6 months for reg OV- HTN and med management.   Return in about 6 months (around 11/27/2022) for HTN.       MLorrene Reid PA-C  [phone] (phone) [phone](fax)  [place]

## 2022-05-30 LAB — COMPREHENSIVE METABOLIC PANEL
ALT: 10 IU/L (ref 0–32)
AST: 15 IU/L (ref 0–40)
Albumin/Globulin Ratio: 2 (ref 1.2–2.2)
Albumin: 4.8 g/dL (ref 3.9–4.9)
Alkaline Phosphatase: 30 IU/L — ABNORMAL LOW (ref 44–121)
BUN/Creatinine Ratio: 14 (ref 9–23)
BUN: 10 mg/dL (ref 6–24)
Bilirubin Total: 0.5 mg/dL (ref 0.0–1.2)
CO2: 22 mmol/L (ref 20–29)
Calcium: 9.6 mg/dL (ref 8.7–10.2)
Chloride: 102 mmol/L (ref 96–106)
Creatinine, Ser: 0.69 mg/dL (ref 0.57–1.00)
Globulin, Total: 2.4 g/dL (ref 1.5–4.5)
Glucose: 89 mg/dL (ref 70–99)
Potassium: 4.4 mmol/L (ref 3.5–5.2)
Sodium: 138 mmol/L (ref 134–144)
Total Protein: 7.2 g/dL (ref 6.0–8.5)
eGFR: 108 mL/min/{1.73_m2} (ref >59)

## 2022-05-30 LAB — CBC WITH DIFFERENTIAL/PLATELET
Basophils Absolute: 0 10*3/uL (ref 0.0–0.2)
Basos: 1 %
EOS (ABSOLUTE): 0 10*3/uL (ref 0.0–0.4)
Eos: 1 %
Hematocrit: 38.4 % (ref 34.0–46.6)
Hemoglobin: 13.1 g/dL (ref 11.1–15.9)
Immature Grans (Abs): 0 10*3/uL (ref 0.0–0.1)
Immature Granulocytes: 0 %
Lymphocytes Absolute: 1.3 10*3/uL (ref 0.7–3.1)
Lymphs: 31 %
MCH: 29.4 pg (ref 26.6–33.0)
MCHC: 34.1 g/dL (ref 31.5–35.7)
MCV: 86 fL (ref 79–97)
Monocytes Absolute: 0.3 10*3/uL (ref 0.1–0.9)
Monocytes: 8 %
Neutrophils Absolute: 2.6 10*3/uL (ref 1.4–7.0)
Neutrophils: 59 %
Platelets: 238 10*3/uL (ref 150–450)
RBC: 4.45 x10E6/uL (ref 3.77–5.28)
RDW: 14.1 % (ref 11.7–15.4)
WBC: 4.3 10*3/uL (ref 3.4–10.8)

## 2022-05-30 LAB — TSH: TSH: 0.812 u[IU]/mL (ref 0.450–4.500)

## 2022-05-30 LAB — LIPID PANEL
Chol/HDL Ratio: 2.3 ratio (ref 0.0–4.4)
Cholesterol, Total: 160 mg/dL (ref 100–199)
HDL: 69 mg/dL (ref >39)
LDL Chol Calc (NIH): 79 mg/dL (ref 0–99)
Triglycerides: 62 mg/dL (ref 0–149)
VLDL Cholesterol Cal: 12 mg/dL (ref 5–40)

## 2022-05-30 LAB — HEMOGLOBIN A1C
Est. average glucose Bld gHb Est-mCnc: 108 mg/dL
Hgb A1c MFr Bld: 5.4 % (ref 4.8–5.6)

## 2022-05-30 LAB — VITAMIN D 25 HYDROXY (VIT D DEFICIENCY, FRACTURES): Vit D, 25-Hydroxy: 44.9 ng/mL (ref 30.0–100.0)

## 2022-06-04 ENCOUNTER — Encounter: Payer: Self-pay | Admitting: Physician Assistant

## 2022-11-15 ENCOUNTER — Encounter: Payer: Self-pay | Admitting: Nurse Practitioner

## 2022-11-15 DIAGNOSIS — I10 Essential (primary) hypertension: Secondary | ICD-10-CM

## 2022-11-15 MED ORDER — LOSARTAN POTASSIUM 25 MG PO TABS: 50.0000 mg | ORAL_TABLET | Freq: Every day | ORAL | 0 refills | Status: DC

## 2022-11-27 ENCOUNTER — Ambulatory Visit: Payer: BC Managed Care – PPO | Admitting: Nurse Practitioner

## 2022-12-04 ENCOUNTER — Encounter: Payer: Self-pay | Admitting: Family Medicine

## 2022-12-04 ENCOUNTER — Ambulatory Visit (INDEPENDENT_AMBULATORY_CARE_PROVIDER_SITE_OTHER): Payer: BC Managed Care – PPO | Admitting: Family Medicine

## 2022-12-04 VITALS — BP 138/82 | HR 80 | Resp 18 | Ht 65.0 in | Wt 173.0 lb

## 2022-12-04 DIAGNOSIS — Z1159 Encounter for screening for other viral diseases: Secondary | ICD-10-CM | POA: Diagnosis not present

## 2022-12-04 DIAGNOSIS — M25552 Pain in left hip: Secondary | ICD-10-CM | POA: Diagnosis not present

## 2022-12-04 DIAGNOSIS — I10 Essential (primary) hypertension: Secondary | ICD-10-CM

## 2022-12-04 MED ORDER — LOSARTAN POTASSIUM 25 MG PO TABS
50.0000 mg | ORAL_TABLET | Freq: Every day | ORAL | 1 refills | Status: DC
Start: 2022-12-04 — End: 2023-04-01

## 2022-12-04 NOTE — Patient Instructions (Signed)
I will send you a message on MyChart once we get the results back from your labs and your x-ray.  You should be receiving a call to schedule the x-ray.  I will see you in about 6 months for your annual physical.  If we do need to schedule sooner than that, I will be in touch.

## 2022-12-04 NOTE — Assessment & Plan Note (Addendum)
Stable, at goal less than 140/90.  Continue losartan 25 mg daily.  We discussed that it would be fabulous if she was able to get her blood pressure less than 130/80.  Encouraged to make some time for self-care and to continue ambulatory blood pressure monitoring, limiting salt, and getting routine physical activity.  Collecting CBC and CMP for medication monitoring.  Will continue to monitor. BP goal <140/90 given that patient does not have ASCVD, CKD, DM, HF, less than 70 years old, and ASCVD risk is <10%. The 10-year ASCVD risk score (Arnett DK, et al., 2019) is: 0.8%

## 2022-12-04 NOTE — Progress Notes (Signed)
Established Patient Office Visit  Subjective   Patient ID: Robin Barajas, female    DOB: Dec 12, 1975  Age: 47 y.o. MRN: [id number]  Chief Complaint  Patient presents with   Hypertension   Hip Pain    Left     HPI Robin Barajas is a 47 y.o. female presenting today for follow up of hypertension.  She also complains of 8/10 left hip pain that started about 6 months ago and has been gradually worsening.  She finds that it improves with movement but is worse with inactivity such as resting or trying to sleep.  She endorses morning stiffness that gradually gets better throughout the day.  The dull, burning pain is isolated to only her left hip.  She also has chronic back pain but finds that this is different. Hypertension: Patient here for follow-up of elevated blood pressure. She is not exercising and is not adherent to low salt diet.  She admits that she has had increased stress going from part-time to full-time and being busy preparing for her son to graduate.  She knows that she needs to do more to take care of herself but has not been doing so.  Pt denies chest pain, SOB, dizziness, edema, syncope, fatigue or heart palpitations. Taking losartan, reports excellent compliance with treatment. Denies side effects.  ROS Negative unless otherwise noted in HPI   Objective:     BP 138/82 (BP Location: Right Arm, Patient Position: Sitting, Cuff Size: Normal)   Pulse 80   Resp 18   Ht 5\' 5"  (1.651 m)   Wt 173 lb (78.5 kg)   SpO2 100%   BMI 28.79 kg/m   Physical Exam Constitutional:      General: She is not in acute distress.    Appearance: Normal appearance.  HENT:     Head: Normocephalic and atraumatic.  Cardiovascular:     Rate and Rhythm: Normal rate and regular rhythm.     Pulses: Normal pulses.     Heart sounds: No murmur heard.    No friction rub. No gallop.  Pulmonary:     Effort: Pulmonary effort is normal. No respiratory distress.     Breath sounds: No wheezing, rhonchi or  rales.  Musculoskeletal:        General: Normal range of motion.     Right lower leg: No edema.     Left lower leg: No edema.  Skin:    General: Skin is warm and dry.  Neurological:     Mental Status: She is alert and oriented to person, place, and time.     Assessment & Plan:  Primary hypertension Assessment & Plan: Stable, at goal less than 140/90.  Continue losartan 25 mg daily.  We discussed that it would be fabulous if she was able to get her blood pressure less than 130/80.  Encouraged to make some time for self-care and to continue ambulatory blood pressure monitoring, limiting salt, and getting routine physical activity.  Collecting CBC and CMP for medication monitoring.  Will continue to monitor. BP goal <140/90 given that patient does not have ASCVD, CKD, DM, HF, less than 26 years old, and ASCVD risk is <10%. The 10-year ASCVD risk score (Arnett DK, et al., 2019) is: 0.8%   Orders: -     CBC with Differential/Platelet; Future -     Comprehensive metabolic panel; Future -     Losartan Potassium; Take 2 tablets (50 mg total) by mouth daily.  Dispense: 90 tablet;  Refill: 1  Pain of left hip Assessment & Plan: Given progression of pain and presentation of morning stiffness/pain, suspect rheumatoid arthritis.  Ordering labs to test for rheumatoid arthritis, also ordering hip x-ray to rule out bony abnormality or malignancy.  We discussed that management will depend on results of imaging and labs.  For now, continue with conservative measures including Tylenol/ibuprofen, ice and heat, and activity as tolerated.  Patient agreeable with this plan.  Orders: -     Sedimentation rate; Future -     C-reactive protein; Future -     Rheumatoid Arthritis Profile; Future -     DG HIPS BILAT W OR W/O PELVIS 2V; Future  Screening for viral disease -     Hepatitis C antibody; Future  Patient agreeable to hepatitis C screening today.  Return in about 6 months (around 06/06/2023) for  annual physical, fasting blood work 1 week before.  Will follow-up sooner if needed based on results of imaging and labs.   Melida Quitter, PA

## 2022-12-04 NOTE — Assessment & Plan Note (Signed)
Given progression of pain and presentation of morning stiffness/pain, suspect rheumatoid arthritis.  Ordering labs to test for rheumatoid arthritis, also ordering hip x-ray to rule out bony abnormality or malignancy.  We discussed that management will depend on results of imaging and labs.  For now, continue with conservative measures including Tylenol/ibuprofen, ice and heat, and activity as tolerated.  Patient agreeable with this plan.

## 2022-12-05 LAB — COMPREHENSIVE METABOLIC PANEL
Alkaline Phosphatase: 42 IU/L — ABNORMAL LOW (ref 44–121)
CO2: 23 mmol/L (ref 20–29)
Glucose: 78 mg/dL (ref 70–99)
Sodium: 141 mmol/L (ref 134–144)
Total Protein: 7.1 g/dL (ref 6.0–8.5)

## 2022-12-05 LAB — CBC WITH DIFFERENTIAL/PLATELET
EOS (ABSOLUTE): 0 10*3/uL (ref 0.0–0.4)
Eos: 1 %
Immature Grans (Abs): 0 10*3/uL (ref 0.0–0.1)
Immature Granulocytes: 0 %
MCV: 89 fL (ref 79–97)
Monocytes: 6 %
Neutrophils Absolute: 2.4 10*3/uL (ref 1.4–7.0)

## 2022-12-05 LAB — SEDIMENTATION RATE: Sed Rate: 9 mm/hr (ref 0–32)

## 2022-12-05 LAB — HEPATITIS C ANTIBODY: Hep C Virus Ab: NONREACTIVE

## 2022-12-06 LAB — CBC WITH DIFFERENTIAL/PLATELET
Basophils Absolute: 0 10*3/uL (ref 0.0–0.2)
Basos: 0 %
Hematocrit: 39.8 % (ref 34.0–46.6)
Hemoglobin: 13.2 g/dL (ref 11.1–15.9)
Lymphocytes Absolute: 1.2 10*3/uL (ref 0.7–3.1)
Lymphs: 31 %
MCH: 29.6 pg (ref 26.6–33.0)
MCHC: 33.2 g/dL (ref 31.5–35.7)
Monocytes Absolute: 0.2 10*3/uL (ref 0.1–0.9)
Neutrophils: 62 %
Platelets: 281 10*3/uL (ref 150–450)
RBC: 4.46 x10E6/uL (ref 3.77–5.28)
RDW: 12.5 % (ref 11.7–15.4)
WBC: 3.9 10*3/uL (ref 3.4–10.8)

## 2022-12-06 LAB — COMPREHENSIVE METABOLIC PANEL
ALT: 15 IU/L (ref 0–32)
AST: 17 IU/L (ref 0–40)
Albumin/Globulin Ratio: 1.8 (ref 1.2–2.2)
Albumin: 4.6 g/dL (ref 3.9–4.9)
BUN/Creatinine Ratio: 15 (ref 9–23)
BUN: 11 mg/dL (ref 6–24)
Bilirubin Total: 0.4 mg/dL (ref 0.0–1.2)
Calcium: 9.8 mg/dL (ref 8.7–10.2)
Chloride: 103 mmol/L (ref 96–106)
Creatinine, Ser: 0.74 mg/dL (ref 0.57–1.00)
Globulin, Total: 2.5 g/dL (ref 1.5–4.5)
Potassium: 4.7 mmol/L (ref 3.5–5.2)
eGFR: 100 mL/min/{1.73_m2} (ref >59)

## 2022-12-06 LAB — C-REACTIVE PROTEIN: CRP: 1 mg/L (ref 0–10)

## 2022-12-06 LAB — RHEUMATOID ARTHRITIS PROFILE
Cyclic Citrullin Peptide Ab: 6 units (ref 0–19)
Rheumatoid fact SerPl-aCnc: <10 IU/mL (ref <14.0)

## 2022-12-09 ENCOUNTER — Encounter: Payer: Self-pay | Admitting: Family Medicine

## 2022-12-09 DIAGNOSIS — M25552 Pain in left hip: Secondary | ICD-10-CM

## 2022-12-10 ENCOUNTER — Ambulatory Visit
Admission: RE | Admit: 2022-12-10 | Discharge: 2022-12-10 | Disposition: A | Discharge status: Home / Self Care | Payer: BC Managed Care – PPO | Source: Ambulatory Visit | Attending: Family Medicine | Admitting: Family Medicine

## 2022-12-10 ENCOUNTER — Other Ambulatory Visit: Payer: Self-pay | Admitting: Family Medicine

## 2022-12-10 DIAGNOSIS — I10 Essential (primary) hypertension: Secondary | ICD-10-CM

## 2022-12-10 DIAGNOSIS — M25559 Pain in unspecified hip: Secondary | ICD-10-CM | POA: Diagnosis not present

## 2022-12-10 DIAGNOSIS — Z1159 Encounter for screening for other viral diseases: Secondary | ICD-10-CM

## 2022-12-10 DIAGNOSIS — M25552 Pain in left hip: Secondary | ICD-10-CM

## 2022-12-16 NOTE — Addendum Note (Signed)
Addended by: Saralyn Pilar on: 12/16/2022 01:17 PM   Modules accepted: Orders

## 2022-12-25 ENCOUNTER — Encounter: Payer: Self-pay | Admitting: Orthopaedic Surgery

## 2022-12-25 ENCOUNTER — Ambulatory Visit (INDEPENDENT_AMBULATORY_CARE_PROVIDER_SITE_OTHER): Payer: BC Managed Care – PPO | Admitting: Orthopaedic Surgery

## 2022-12-25 DIAGNOSIS — M25552 Pain in left hip: Secondary | ICD-10-CM | POA: Diagnosis not present

## 2022-12-25 MED ORDER — PREDNISONE 10 MG (21) PO TBPK
ORAL_TABLET | ORAL | 3 refills | Status: DC
Start: 2022-12-25 — End: 2023-06-06

## 2022-12-25 MED ORDER — TIZANIDINE HCL 4 MG PO TABS: 4.0000 mg | ORAL_TABLET | Freq: Four times a day (QID) | ORAL | 2 refills | Status: DC | PRN

## 2022-12-25 MED ORDER — NAPROXEN 500 MG PO TABS: 500.0000 mg | ORAL_TABLET | Freq: Two times a day (BID) | ORAL | 3 refills | Status: DC

## 2022-12-25 NOTE — Progress Notes (Signed)
Office Visit Note   Patient: Robin Barajas           Date of Birth: May 14, 1976           MRN: [id number] Visit Date: 12/25/2022              Requested by: Melida Quitter, PA [address] [place],  [place] [address] PCP: Melida Quitter, PA   Assessment & Plan: Visit Diagnoses:  1. Pain of left hip     Plan: Robin Barajas is a 47 year old female with left hip pain.  I think it is coming from her abductors rather than her back.  She is not that tender over the trochanter but more so in the muscle belly.  I recommend trying some medications for this which I have sent in have also made a referral for her to go to physical therapy.  She should follow-up with [place] if symptoms persist past 6 weeks.  Follow-Up Instructions: No follow-ups on file.   Orders:  Orders Placed This Encounter  Procedures   Ambulatory referral to Physical Therapy   Meds ordered this encounter  Medications   tiZANidine (ZANAFLEX) 4 MG tablet    Sig: Take 1 tablet (4 mg total) by mouth every 6 (six) hours as needed for muscle spasms.    Dispense:  30 tablet    Refill:  2   predniSONE (STERAPRED UNI-PAK 21 TAB) 10 MG (21) TBPK tablet    Sig: Take as directed    Dispense:  21 tablet    Refill:  3   naproxen (NAPROSYN) 500 MG tablet    Sig: Take 1 tablet (500 mg total) by mouth 2 (two) times daily with a meal.    Dispense:  30 tablet    Refill:  3      Procedures: No procedures performed   Clinical Data: No additional findings.   Subjective: Chief Complaint  Patient presents with   Left Hip - Pain    HPI Robin Barajas is a very pleasant 47 year old female who for started having left hip pain about 6 months ago and has gotten worse.  It is mainly in the lateral hip and buttock region.  She has chronic low back pain that she feels is not related.  She takes Advil and Tylenol for the symptoms.  Sometimes has numbness and tingling.  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  Review of  Systems  Constitutional: Negative.   HENT: Negative.    Eyes: Negative.   Respiratory: Negative.    Cardiovascular: Negative.   Endocrine: Negative.   Musculoskeletal: Negative.   Neurological: Negative.   Hematological: Negative.   Psychiatric/Behavioral: Negative.    All other systems reviewed and are negative.    Objective: Vital Signs: There were no vitals taken for this visit.  Physical Exam Vitals and nursing note reviewed.  Constitutional:      Appearance: She is well-developed.  HENT:     Head: Atraumatic.     Nose: Nose normal.  Eyes:     Extraocular Movements: Extraocular movements intact.  Cardiovascular:     Pulses: Normal pulses.  Pulmonary:     Effort: Pulmonary effort is normal.  Abdominal:     Palpations: Abdomen is soft.  Musculoskeletal:     Cervical back: Neck supple.  Skin:    General: Skin is warm.     Capillary Refill: Capillary refill takes less than 2 seconds.  Neurological:  Mental Status: She is alert. Mental status is at baseline.  Psychiatric:        Behavior: Behavior normal.        Thought Content: Thought content normal.        Judgment: Judgment normal.     Ortho Exam Examination of the left hip shows fluid range of motion.  No reproducible groin pain.  She is more tender in the abductor muscles than the tendinous portion.  The trochanter is not overly tender.  Negative Stinchfield sign.  Negative logroll. Specialty Comments:  No specialty comments available.  Imaging: No results found.   PMFS History: Patient Active Problem List   Diagnosis Date Noted   Pain of left hip 12/04/2022   Generalized headaches 09/17/2017   Vitamin D insufficiency 02/24/2017   History of diagnosis of MVP (mitral valve prolapse) 02/03/2017   Overweight (BMI 25.0-29.9) 02/03/2017   personal history of depression 02/03/2017   History of appendectomy 02/03/2017   Inactivity 02/03/2017   Primary hypertension 02/03/2017   Past Medical  History:  Diagnosis Date   Allergy    Generalized headaches    Heart murmur    Hypertension 2003    Family History  Problem Relation Age of Onset   Depression Mother    Colon polyps Father    Depression Sister    Alcohol abuse Maternal Grandfather    Cancer Paternal Grandmother        ovarian   Diabetes Paternal Grandfather    Colon cancer Neg Hx    Esophageal cancer Neg Hx    Rectal cancer Neg Hx    Stomach cancer Neg Hx     Past Surgical History:  Procedure Laterality Date   APPENDECTOMY  1990   ELBOW FRACTURE SURGERY  1984   WISDOM TOOTH EXTRACTION     Social History   Occupational History   Not on file  Tobacco Use   Smoking status: Never    Passive exposure: Never   Smokeless tobacco: Never  Vaping Use   Vaping Use: Never used  Substance and Sexual Activity   Alcohol use: No   Drug use: No   Sexual activity: Yes

## 2023-01-07 ENCOUNTER — Encounter: Payer: Self-pay | Admitting: Physical Therapy

## 2023-01-07 ENCOUNTER — Other Ambulatory Visit: Payer: Self-pay

## 2023-01-07 ENCOUNTER — Ambulatory Visit (INDEPENDENT_AMBULATORY_CARE_PROVIDER_SITE_OTHER): Payer: BC Managed Care – PPO | Admitting: Physical Therapy

## 2023-01-07 DIAGNOSIS — M6281 Muscle weakness (generalized): Secondary | ICD-10-CM | POA: Diagnosis not present

## 2023-01-07 DIAGNOSIS — M25552 Pain in left hip: Secondary | ICD-10-CM

## 2023-01-07 DIAGNOSIS — R262 Difficulty in walking, not elsewhere classified: Secondary | ICD-10-CM

## 2023-01-07 NOTE — Therapy (Signed)
OUTPATIENT PHYSICAL THERAPY LOWER EXTREMITY EVALUATION   Patient Name: Robin Barajas MRN: [id number] DOB:October 13, 1975, 47 y.o., female Today's Date: 01/07/2023  END OF SESSION:  PT End of Session - 01/07/23 1512     Visit Number 1    Number of Visits 20    Date for PT Re-Evaluation 03/21/23    PT Start Time 1513    PT Stop Time 1555    PT Time Calculation (min) 42 min    Activity Tolerance Patient tolerated treatment well    Behavior During Therapy [facility] for tasks assessed/performed             Past Medical History:  Diagnosis Date   Allergy    Generalized headaches    Heart murmur    Hypertension 2003   Past Surgical History:  Procedure Laterality Date   APPENDECTOMY  1990   ELBOW FRACTURE SURGERY  1984   WISDOM TOOTH EXTRACTION     Patient Active Problem List   Diagnosis Date Noted   Pain of left hip 12/04/2022   Generalized headaches 09/17/2017   Vitamin D insufficiency 02/24/2017   History of diagnosis of MVP (mitral valve prolapse) 02/03/2017   Overweight (BMI 25.0-29.9) 02/03/2017   personal history of depression 02/03/2017   History of appendectomy 02/03/2017   Inactivity 02/03/2017   Primary hypertension 02/03/2017    PCP:  Melida Quitter, PA  REFERRING PROVIDER:  Tarry Kos, MD   REFERRING DIAG: [phone] (ICD-10-CM) - Pain of left hip  Rationale for Evaluation and Treatment: Rehabilitation  THERAPY DIAG:  Pain in left hip  Muscle weakness (generalized)  Difficulty in walking, not elsewhere classified  ONSET DATE: 6 months ago   SUBJECTIVE STATEMENT: Pt is a 47 year old female who for started having left hip pain about 6 months ago and has gotten worse.  It is mainly in the lateral hip and buttock region.  She has chronic low back pain that she feels is not related.  She takes Advil and Tylenol for the symptoms.  Sometimes has numbness and tingling.  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  PERTINENT  HISTORY: Allergies, HA, heart murmur, HTN   PAIN:  NPRS scale: 7/10 Pain location: LEFT HIP Pain description: achy, throbbing Aggravating factors: sitting Relieving factors: walking  PRECAUTIONS: None  WEIGHT BEARING RESTRICTIONS: No  FALLS:  Has patient fallen in last 6 months? 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  PLOF: Independent  PATIENT GOALS: Pain relief, everyday functions without pain  Next MD visit: No follow up visits made   OBJECTIVE:   DIAGNOSTIC FINDINGS:  FINDINGS: There is no evidence of hip fracture or dislocation. There is no evidence of arthropathy or other focal bone abnormality.  PATIENT SURVEYS:  01/07/23: FOTO intake:   61%     COGNITION: Overall cognitive status: WFL    SENSATION: WFL    MUSCLE LENGTH: 01/07/23:  Hamstrings: Right 85 deg; Left 80 deg   POSTURE:  rounded shoulders and forward head  PALPATION: 01/07/23: TTP: Left greater trochanter and surrounding musculature  LOWER EXTREMITY ROM:   ROM Right 01/07/23 Left 01/07/23  Hip flexion 105 102  Hip extension 10 in prone 10 in prone  Hip abduction 35 30  Hip adduction    Hip internal rotation  Hip external rotation    Knee flexion    Knee extension    Ankle dorsiflexion    Ankle plantarflexion    Ankle inversion    Ankle eversion     (Blank rows = not tested)  LOWER EXTREMITY MMT:  MMT Right 01/07/23 Left 01/07/23  Hip flexion 5 4  Hip extension    Hip abduction 5 4  Hip adduction 5 4  Hip internal rotation    Hip external rotation    Knee flexion 5 5  Knee extension 5 5  Ankle dorsiflexion    Ankle plantarflexion    Ankle inversion    Ankle eversion     (Blank rows = not tested)  LOWER EXTREMITY SPECIAL TESTS:  01/07/23 FABER  test: negative on left Hip scouring test: negative on left     GAIT: Distance walked: 30 feet, level surface Assistive device utilized: None Level of assistance: Complete Independence Comments: WFL                                                                                                                                                                        TODAY'S TREATMENT                                                                          DATE: 01/07/23 Therex: HEP instruction/performance c cues for techniques, handout provided.  Trial set performed of each for comprehension and symptom assessment.  See below for exercise list  PATIENT EDUCATION:  Education details: HEP, POC Person educated: Patient Education method: Explanation, Demonstration, Verbal cues, and Handouts Education comprehension: verbalized understanding, returned demonstration, and verbal cues required  HOME EXERCISE PROGRAM: Access Code: [email or web address] URL: [email or web address] Date: 01/07/2023 Prepared by: Narda Amber  Exercises - Supine Bridge  - 1 x daily - 7 x weekly - 2 sets - 10 reps - 5 seconds hold - Clamshell with Resistance  - 1 x daily - 7 x weekly - 2 sets - 10 reps - Sidelying Reverse Clamshell  - 1 x daily - 7 x weekly - 2 sets - 10 reps - Supine Piriformis Stretch with Foot on Ground  - 1 x daily - 7 x weekly - 3 reps - 20 seconds hold - Supine Figure 4 Piriformis Stretch  - 1 x daily - 7 x weekly - 3 reps - 20 seconds hold  ASSESSMENT:  CLINICAL IMPRESSION: Patient is a 47 y.o. who comes to clinic with complaints  of left pain with mobility, strength and movement coordination deficits that impair their ability to perform usual daily and recreational functional activities without increase difficulty/symptoms at this time.  Patient to benefit from skilled PT services to address impairments and limitations to improve to previous level of function without restriction  secondary to condition.   OBJECTIVE IMPAIRMENTS: decreased mobility, difficulty walking, decreased ROM, decreased strength, and pain.   ACTIVITY LIMITATIONS: bending, sitting, standing, squatting, sleeping, and stairs  PARTICIPATION LIMITATIONS: community activity and occupation  PERSONAL FACTORS: 1-2 comorbidities: see above pertinent history  are also affecting patient's functional outcome.   REHAB POTENTIAL: Good  CLINICAL DECISION MAKING: Stable/uncomplicated  EVALUATION COMPLEXITY: Low   GOALS: Goals reviewed with patient? Yes  SHORT TERM GOALS: (target date for Short term goals are 3 weeks 01/31/23)   1.  Patient will demonstrate independent use of home exercise program to maintain progress from in clinic treatments.  Goal status: New  LONG TERM GOALS: (target dates for all long term goals are 10 weeks  03/21/23 )   1. Patient will demonstrate/report pain at worst less than or equal to 2/10 to facilitate minimal limitation in daily activity secondary to pain symptoms.  Goal status: New   2. Patient will demonstrate independent use of home exercise program to facilitate ability to maintain/progress functional gains from skilled physical therapy services.  Goal status: New   3. Patient will demonstrate FOTO outcome > or = 75 % to indicate reduced disability due to condition.  Goal status: New   4.  Patient will demonstrate left LE MMT 5/5 throughout to faciltiate usual transfers, stairs, squatting at [facility] for daily life.   Goal status: New   5.  Patient will be able to navigate up and down 1 flight of stairs with no rail with reciprocal gait pattern.  Goal status: New   6.  Pt will be able to report sitting >/= 30 minutes without pain.  Goal status: New      PLAN:  PT FREQUENCY: 1-2x/week  PT DURATION: 10 weeks  PLANNED INTERVENTIONS: Therapeutic exercises, Therapeutic activity, Neuro Muscular re-education, Balance training, Gait training, Patient/Family  education, Joint mobilization, Stair training, DME instructions, Dry Needling, Electrical stimulation, Traction, Cryotherapy, vasopneumatic deviceMoist heat, Taping, Ultrasound, Ionotophoresis 4mg /ml Dexamethasone, and aquatic therapy, Manual therapy.  All included unless contraindicated  PLAN FOR NEXT SESSION: Review HEP knowledge/results. LE strengthening, stretching,                                                                                                                                                                                   Sharmon Leyden, PT, MPT 01/07/2023, 3:13 PM

## 2023-01-09 ENCOUNTER — Ambulatory Visit (INDEPENDENT_AMBULATORY_CARE_PROVIDER_SITE_OTHER): Payer: BC Managed Care – PPO | Admitting: Rehabilitative and Restorative Service Providers"

## 2023-01-09 ENCOUNTER — Encounter: Payer: Self-pay | Admitting: Rehabilitative and Restorative Service Providers"

## 2023-01-09 DIAGNOSIS — M25552 Pain in left hip: Secondary | ICD-10-CM | POA: Diagnosis not present

## 2023-01-09 DIAGNOSIS — M6281 Muscle weakness (generalized): Secondary | ICD-10-CM | POA: Diagnosis not present

## 2023-01-09 DIAGNOSIS — R262 Difficulty in walking, not elsewhere classified: Secondary | ICD-10-CM | POA: Diagnosis not present

## 2023-01-09 NOTE — Therapy (Signed)
OUTPATIENT PHYSICAL THERAPY TREATMENT   Patient Name: Robin Barajas MRN: [id number] DOB:1976-05-09, 47 y.o., female Today's Date: 01/09/2023  END OF SESSION:  PT End of Session - 01/09/23 1439     Visit Number 2    Number of Visits 20    Date for PT Re-Evaluation 03/21/23    PT Start Time 1432    PT Stop Time 1510    PT Time Calculation (min) 38 min    Activity Tolerance Patient tolerated treatment well    Behavior During Therapy [facility] for tasks assessed/performed              Past Medical History:  Diagnosis Date   Allergy    Generalized headaches    Heart murmur    Hypertension 2003   Past Surgical History:  Procedure Laterality Date   APPENDECTOMY  1990   ELBOW FRACTURE SURGERY  1984   WISDOM TOOTH EXTRACTION     Patient Active Problem List   Diagnosis Date Noted   Pain of left hip 12/04/2022   Generalized headaches 09/17/2017   Vitamin D insufficiency 02/24/2017   History of diagnosis of MVP (mitral valve prolapse) 02/03/2017   Overweight (BMI 25.0-29.9) 02/03/2017   personal history of depression 02/03/2017   History of appendectomy 02/03/2017   Inactivity 02/03/2017   Primary hypertension 02/03/2017    PCP: Melida Quitter, PA  REFERRING PROVIDER: Tarry Kos, MD   REFERRING DIAG: [phone] (ICD-10-CM) - Pain of left hip  Rationale for Evaluation and Treatment: Rehabilitation  THERAPY DIAG:  Pain in left hip  Muscle weakness (generalized)  Difficulty in walking, not elsewhere classified  ONSET DATE: 6 months ago   SUBJECTIVE STATEMENT: Pt indicated about the same overall since last visit.  No complaints with exercises.  Reported "uncomfortable upon arrival."  PERTINENT HISTORY: Allergies, HA, heart murmur, HTN   PAIN:  NPRS scale: 3/10 Pain location: Lt hip Pain description: achy, throbbing Aggravating factors: sitting Relieving factors: walking  PRECAUTIONS: None  WEIGHT BEARING RESTRICTIONS: No  FALLS:  Has patient  fallen in last 6 months? 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 There is no evidence of arthropathy or other focal bone abnormality.  PATIENT SURVEYS:  01/07/23: FOTO intake:   61%     COGNITION: 01/07/2023 Overall cognitive status: WFL    SENSATION: 01/07/2023 WFL  MUSCLE LENGTH: 01/07/23:  Hamstrings: Right 85 deg; Left 80 deg   POSTURE:  01/07/2023 rounded shoulders and forward head  PALPATION: 01/07/23: TTP: Left greater trochanter and surrounding musculature  LOWER EXTREMITY ROM:   ROM Right 01/07/23 Left 01/07/23  Hip flexion 105 102  Hip extension 10 in prone 10 in prone  Hip abduction 35 30  Hip adduction    Hip internal rotation    Hip external rotation    Knee flexion    Knee extension    Ankle dorsiflexion    Ankle plantarflexion    Ankle inversion    Ankle eversion     (Blank rows = not tested)  LOWER EXTREMITY MMT:  MMT Right 01/07/23 Left 01/07/23  Hip flexion 5  4  Hip extension    Hip abduction 5 4  Hip adduction 5 4  Hip internal rotation    Hip external rotation    Knee flexion 5 5  Knee extension 5 5  Ankle dorsiflexion    Ankle plantarflexion    Ankle inversion    Ankle eversion     (Blank rows = not tested)  LOWER EXTREMITY SPECIAL TESTS:  01/07/23 FABER test: negative on left Hip scouring test: negative on left     GAIT: 01/07/2023 Distance walked: 30 feet, level surface Assistive device utilized: None Level of assistance: Complete Independence Comments: WFL                                                                                                                                                                         TODAY'S TREATMENT                                                                          DATE: 01/09/2023 Manual Compression to Lt glute max, proximal hamstring  Trigger Point Dry-Needling  Treatment instructions: Expect mild to moderate muscle soreness. S/S of pneumothorax if dry needled over a lung field, and to seek immediate medical attention should they occur. Patient verbalized understanding of these instructions and education.  Patient Consent Given: Yes Education handout provided: Previously provided Muscles treated: Lt glute max, proximal hamstring near attachment Treatment response/outcome: concordant local symptoms with twitch response  Therex: Supine figure 4 push away stretch Lt leg 30 sec x 3 Supine figure 4 pull towards stretch Lt leg 30 sec x 3 Supine bridge 3 sec hold x 15 Verbal review of existing HEP Nustep Lvl 6 10 mins UE/LE    TODAY'S TREATMENT                                                                          DATE: 01/07/23 Therex: HEP instruction/performance c cues for techniques, handout provided.  Trial set performed of each for comprehension and symptom assessment.  See below for exercise list  PATIENT EDUCATION:  Education details: HEP, POC Person educated: Patient Education method: Explanation, Demonstration, Verbal cues, and Handouts Education comprehension: verbalized understanding, returned  demonstration, and verbal cues required  HOME EXERCISE PROGRAM: Access Code: [email or web address] URL: [email or web address] Date: 01/07/2023 Prepared by: Narda Amber  Exercises - Supine Bridge  - 1 x daily - 7 x weekly - 2 sets - 10 reps - 5 seconds hold - Clamshell with Resistance  - 1 x daily - 7 x weekly - 2 sets - 10 reps - Sidelying Reverse Clamshell  - 1 x daily - 7 x weekly - 2 sets - 10 reps - Supine Piriformis  Stretch with Foot on Ground  - 1 x daily - 7 x weekly - 3 reps - 20 seconds hold - Supine Figure 4 Piriformis Stretch  - 1 x daily - 7 x weekly - 3 reps - 20 seconds hold  ASSESSMENT:  CLINICAL IMPRESSION: Positive initial reduction of symptoms during visit today with manual/dry needling.   Good recall of HEP at this time.  Plan to follow up on results from today and treat accordingly.   OBJECTIVE IMPAIRMENTS: decreased mobility, difficulty walking, decreased ROM, decreased strength, and pain.   ACTIVITY LIMITATIONS: bending, sitting, standing, squatting, sleeping, and stairs  PARTICIPATION LIMITATIONS: community activity and occupation  PERSONAL FACTORS: 1-2 comorbidities: see above pertinent history  are also affecting patient's functional outcome.   REHAB POTENTIAL: Good  CLINICAL DECISION MAKING: Stable/uncomplicated  EVALUATION COMPLEXITY: Low   GOALS: Goals reviewed with patient? Yes  SHORT TERM GOALS: (target date for Short term goals are 3 weeks 01/31/2023)   1.  Patient will demonstrate independent use of home exercise program to maintain progress from in clinic treatments.  Goal status: on going 01/09/2023  LONG TERM GOALS: (target dates for all long term goals are 10 weeks  03/21/2023 )   1. Patient will demonstrate/report pain at worst less than or equal to 2/10 to facilitate minimal limitation in daily activity secondary to pain symptoms.  Goal status: New   2. Patient will demonstrate independent use of home exercise program to facilitate ability to maintain/progress functional gains from skilled physical therapy services.  Goal status: New   3. Patient will demonstrate FOTO outcome > or = 75 % to indicate reduced disability due to condition.  Goal status: New   4.  Patient will demonstrate left LE MMT 5/5 throughout to faciltiate usual transfers, stairs, squatting at [facility] for daily life.   Goal status: New   5.  Patient will be able to navigate up and down  1 flight of stairs with no rail with reciprocal gait pattern.  Goal status: New   6.  Pt will be able to report sitting >/= 30 minutes without pain.  Goal status: New      PLAN:  PT FREQUENCY: 1-2x/week  PT DURATION: 10 weeks  PLANNED INTERVENTIONS: Therapeutic exercises, Therapeutic activity, Neuro Muscular re-education, Balance training, Gait training, Patient/Family education, Joint mobilization, Stair training, DME instructions, Dry Needling, Electrical stimulation, Traction, Cryotherapy, vasopneumatic deviceMoist heat, Taping, Ultrasound, Ionotophoresis 4mg /ml Dexamethasone, and aquatic therapy, Manual therapy.  All included unless contraindicated  PLAN FOR NEXT SESSION: DN follow up , use as desired.     Chyrel Masson, PT, DPT, OCS, ATC 01/09/23  3:12 PM

## 2023-01-15 ENCOUNTER — Encounter: Payer: Self-pay | Admitting: Physical Therapy

## 2023-01-15 ENCOUNTER — Ambulatory Visit (INDEPENDENT_AMBULATORY_CARE_PROVIDER_SITE_OTHER): Payer: BC Managed Care – PPO | Admitting: Physical Therapy

## 2023-01-15 DIAGNOSIS — R262 Difficulty in walking, not elsewhere classified: Secondary | ICD-10-CM | POA: Diagnosis not present

## 2023-01-15 DIAGNOSIS — M25552 Pain in left hip: Secondary | ICD-10-CM | POA: Diagnosis not present

## 2023-01-15 DIAGNOSIS — M6281 Muscle weakness (generalized): Secondary | ICD-10-CM

## 2023-01-15 NOTE — Therapy (Signed)
OUTPATIENT PHYSICAL THERAPY TREATMENT   Patient Name: Robin Barajas MRN: [id number] DOB:26-Feb-1976, 47 y.o., female Today's Date: 01/15/2023  END OF SESSION:  PT End of Session - 01/15/23 1506     Visit Number 3    Number of Visits 20    Date for PT Re-Evaluation 03/21/23    PT Start Time 1507    PT Stop Time 1547    PT Time Calculation (min) 40 min    Activity Tolerance Patient tolerated treatment well    Behavior During Therapy [facility] for tasks assessed/performed               Past Medical History:  Diagnosis Date   Allergy    Generalized headaches    Heart murmur    Hypertension 2003   Past Surgical History:  Procedure Laterality Date   APPENDECTOMY  1990   ELBOW FRACTURE SURGERY  1984   WISDOM TOOTH EXTRACTION     Patient Active Problem List   Diagnosis Date Noted   Pain of left hip 12/04/2022   Generalized headaches 09/17/2017   Vitamin D insufficiency 02/24/2017   History of diagnosis of MVP (mitral valve prolapse) 02/03/2017   Overweight (BMI 25.0-29.9) 02/03/2017   personal history of depression 02/03/2017   History of appendectomy 02/03/2017   Inactivity 02/03/2017   Primary hypertension 02/03/2017    PCP: Melida Quitter, PA  REFERRING PROVIDER: Tarry Kos, MD   REFERRING DIAG: [phone] (ICD-10-CM) - Pain of left hip  Rationale for Evaluation and Treatment: Rehabilitation  THERAPY DIAG:  Pain in left hip  Muscle weakness (generalized)  Difficulty in walking, not elsewhere classified  ONSET DATE: 6 months ago   SUBJECTIVE STATEMENT: Feels "different" than last session, but felt DN was helpful  PERTINENT HISTORY: Allergies, HA, heart murmur, HTN   PAIN:  NPRS scale: 3/10 Pain location: Lt hip Pain description: achy, throbbing Aggravating factors: sitting Relieving factors: walking  PRECAUTIONS: None  WEIGHT BEARING RESTRICTIONS: No  FALLS:  Has patient fallen in last 6 months? 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  PLOF: Independent  PATIENT GOALS: Pain relief, everyday functions without pain  OBJECTIVE:   DIAGNOSTIC FINDINGS:  01/07/2023 review of chart:  FINDINGS: There is no evidence of hip fracture or dislocation. There is no evidence of arthropathy or other focal bone abnormality.  PATIENT SURVEYS:  01/07/23: FOTO intake:   61%     COGNITION: 01/07/2023 Overall cognitive status: WFL    SENSATION: 01/07/2023 WFL  MUSCLE LENGTH: 01/07/23:  Hamstrings: Right 85 deg; Left 80 deg   POSTURE:  01/07/2023 rounded shoulders and forward head  PALPATION: 01/07/23: TTP: Left greater trochanter and surrounding musculature  LOWER EXTREMITY ROM:   ROM Right 01/07/23 Left 01/07/23  Hip flexion 105 102  Hip extension 10 in prone 10 in prone  Hip abduction 35 30  Hip adduction    Hip internal rotation    Hip external rotation    Knee flexion    Knee extension    Ankle dorsiflexion    Ankle plantarflexion    Ankle inversion    Ankle eversion     (Blank rows = not tested)  LOWER EXTREMITY MMT:  MMT Right 01/07/23 Left 01/07/23  Hip flexion 5 4  Hip extension    Hip  abduction 5 4  Hip adduction 5 4  Hip internal rotation    Hip external rotation    Knee flexion 5 5  Knee extension 5 5  Ankle dorsiflexion    Ankle plantarflexion    Ankle inversion    Ankle eversion     (Blank rows = not tested)  LOWER EXTREMITY SPECIAL TESTS:  01/07/23 FABER test: negative on left Hip scouring test: negative on left   GAIT: 01/07/2023 Distance walked: 30 feet, level surface Assistive device utilized: None Level of assistance: Complete Independence Comments: WFL                                                                                                                                                                         TODAY'S TREATMENT                                                                          DATE: 01/15/2023 Manual Compression to Lt glute min and piriformis,skilled palpation and monitoring of soft tissue during DN  Trigger Point Dry-Needling  Treatment instructions: Expect mild to moderate muscle soreness. S/S of pneumothorax if dry needled over a lung field, and to seek immediate medical attention should they occur. Patient verbalized understanding of these instructions and education.  Patient Consent Given: Yes Education handout provided: Previously provided Muscles treated: Lt glute min and piriformis Treatment response/outcome: concordant local symptoms with twitch response  Therex: NuStep L6 x 10 min Sidelying hip circles x 10 circles each way on Lt Sidelying clamshells with L4 band on Lt 2x10 Hook lying single limb clamshell 2x10 bil; L4 band Supine figure 4 pull towards stretch Lt leg 30 sec x 2 Supine figure 4 push away stretch Lt leg 30 sec x 2 Standing hip hike x 10 reps on Lt with 5 sec hold    TODAY'S TREATMENT                                                                          DATE: 01/09/2023 Manual Compression to Lt glute max, proximal hamstring  Trigger Point Dry-Needling  Treatment instructions: Expect mild to moderate muscle soreness. S/S of pneumothorax if dry needled over  a lung field, and to seek immediate medical attention should they occur. Patient verbalized understanding of these instructions and education.  Patient Consent Given: Yes Education handout provided: Previously provided Muscles treated: Lt glute max, proximal hamstring near attachment Treatment response/outcome: concordant local symptoms with twitch response  Therex: Supine figure 4 push away stretch Lt leg 30 sec x 3 Supine figure 4 pull towards stretch Lt leg 30 sec x 3 Supine bridge 3 sec hold x 15 Verbal review of existing  HEP Nustep Lvl 6 10 mins UE/LE    TODAY'S TREATMENT                                                                          DATE: 01/07/23 Therex: HEP instruction/performance c cues for techniques, handout provided.  Trial set performed of each for comprehension and symptom assessment.  See below for exercise list  PATIENT EDUCATION:  Education details: HEP, POC Person educated: Patient Education method: Explanation, Demonstration, Verbal cues, and Handouts Education comprehension: verbalized understanding, returned demonstration, and verbal cues required  HOME EXERCISE PROGRAM: Access Code: [email or web address] URL: [email or web address] Date: 01/15/2023 Prepared by: Moshe Cipro  Exercises - Supine Bridge  - 2 x daily - 7 x weekly - 2 sets - 10 reps - 5 seconds hold - Clamshell with Resistance  - 2 x daily - 7 x weekly - 2 sets - 10 reps - Sidelying Reverse Clamshell  - 2 x daily - 7 x weekly - 2 sets - 10 reps - Supine Piriformis Stretch with Foot on Ground  - 2 x daily - 7 x weekly - 3 reps - 20 seconds hold - Supine Figure 4 Piriformis Stretch  - 2 x daily - 7 x weekly - 3 reps - 20 seconds hold - Sidelying Hip Circles  - 2 x daily - 7 x weekly - 2 sets - 10 circles each way - Hooklying Single Leg Bent Knee Fallouts with Resistance  - 2 x daily - 7 x weekly - 2 sets - 10 reps - Standing Hip Hiking  - 2 x daily - 7 x weekly - 1 sets - 10 reps - 5-10 sec hold  Patient Education - TENS Unit  ASSESSMENT:  CLINICAL IMPRESSION: Reduction in pain today after DN and manual therapy.  Able to progress HEP as well.  Good tolerance to session and will continue to benefit from PT to maximize function.  OBJECTIVE IMPAIRMENTS: decreased mobility, difficulty walking, decreased ROM, decreased strength, and pain.   ACTIVITY LIMITATIONS: bending, sitting, standing, squatting, sleeping, and stairs  PARTICIPATION LIMITATIONS: community activity and occupation  PERSONAL FACTORS:  1-2 comorbidities: see above pertinent history  are also affecting patient's functional outcome.   REHAB POTENTIAL: Good  CLINICAL DECISION MAKING: Stable/uncomplicated  EVALUATION COMPLEXITY: Low   GOALS: Goals reviewed with patient? Yes  SHORT TERM GOALS: (target date for Short term goals are 3 weeks 01/31/2023)   1.  Patient will demonstrate independent use of home exercise program to maintain progress from in clinic treatments. Goal status: on going 01/09/2023  LONG TERM GOALS: (target dates for all long term goals are 10 weeks  03/21/2023 )   1. Patient will demonstrate/report pain at worst less than or equal to  2/10 to facilitate minimal limitation in daily activity secondary to pain symptoms. Goal status: New   2. Patient will demonstrate independent use of home exercise program to facilitate ability to maintain/progress functional gains from skilled physical therapy services. Goal status: New   3. Patient will demonstrate FOTO outcome > or = 75 % to indicate reduced disability due to condition. Goal status: New   4.  Patient will demonstrate left LE MMT 5/5 throughout to faciltiate usual transfers, stairs, squatting at [facility] for daily life.  Goal status: New   5.  Patient will be able to navigate up and down 1 flight of stairs with no rail with reciprocal gait pattern.  Goal status: New   6.  Pt will be able to report sitting >/= 30 minutes without pain.  Goal status: New      PLAN:  PT FREQUENCY: 1-2x/week  PT DURATION: 10 weeks  PLANNED INTERVENTIONS: Therapeutic exercises, Therapeutic activity, Neuro Muscular re-education, Balance training, Gait training, Patient/Family education, Joint mobilization, Stair training, DME instructions, Dry Needling, Electrical stimulation, Traction, Cryotherapy, vasopneumatic deviceMoist heat, Taping, Ultrasound, Ionotophoresis 4mg /ml Dexamethasone, and aquatic therapy, Manual therapy.  All included unless contraindicated  PLAN FOR  NEXT SESSION: assess response to DN, continue hip strengthening   Clarita Crane, PT, DPT 01/15/23 3:49 PM

## 2023-01-28 ENCOUNTER — Encounter: Payer: Self-pay | Admitting: Physical Therapy

## 2023-01-28 ENCOUNTER — Ambulatory Visit (INDEPENDENT_AMBULATORY_CARE_PROVIDER_SITE_OTHER): Payer: BC Managed Care – PPO | Admitting: Physical Therapy

## 2023-01-28 DIAGNOSIS — M25552 Pain in left hip: Secondary | ICD-10-CM | POA: Diagnosis not present

## 2023-01-28 DIAGNOSIS — M6281 Muscle weakness (generalized): Secondary | ICD-10-CM | POA: Diagnosis not present

## 2023-01-28 DIAGNOSIS — R262 Difficulty in walking, not elsewhere classified: Secondary | ICD-10-CM

## 2023-01-28 NOTE — Therapy (Signed)
OUTPATIENT PHYSICAL THERAPY TREATMENT   Patient Name: Robin Barajas MRN: [id number] DOB:Apr 03, 1976, 47 y.o., female Today's Date: 01/28/2023  END OF SESSION:  PT End of Session - 01/28/23 1515     Visit Number 4    Number of Visits 20    Date for PT Re-Evaluation 03/21/23    PT Start Time 1430    PT Stop Time 1515    PT Time Calculation (min) 45 min    Activity Tolerance Patient tolerated treatment well    Behavior During Therapy [facility] for tasks assessed/performed                Past Medical History:  Diagnosis Date   Allergy    Generalized headaches    Heart murmur    Hypertension 2003   Past Surgical History:  Procedure Laterality Date   APPENDECTOMY  1990   ELBOW FRACTURE SURGERY  1984   WISDOM TOOTH EXTRACTION     Patient Active Problem List   Diagnosis Date Noted   Pain of left hip 12/04/2022   Generalized headaches 09/17/2017   Vitamin D insufficiency 02/24/2017   History of diagnosis of MVP (mitral valve prolapse) 02/03/2017   Overweight (BMI 25.0-29.9) 02/03/2017   personal history of depression 02/03/2017   History of appendectomy 02/03/2017   Inactivity 02/03/2017   Primary hypertension 02/03/2017    PCP: Melida Quitter, PA  REFERRING PROVIDER: Tarry Kos, MD   REFERRING DIAG: [phone] (ICD-10-CM) - Pain of left hip  Rationale for Evaluation and Treatment: Rehabilitation  THERAPY DIAG:  Pain in left hip  Muscle weakness (generalized)  Difficulty in walking, not elsewhere classified  ONSET DATE: 6 months ago   SUBJECTIVE STATEMENT: Pt reporting 7/10 pain upon arrival and reporting missing last week while on vacation set her back a little.   PERTINENT HISTORY: Allergies, HA, heart murmur, HTN   PAIN:  NPRS scale: 7/10 Pain location: Lt hip Pain description: achy, throbbing Aggravating factors: sitting Relieving factors: walking  PRECAUTIONS: None  WEIGHT BEARING RESTRICTIONS: No  FALLS:  Has patient fallen in last 6  months? 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 There is no evidence of arthropathy or other focal bone abnormality.  PATIENT SURVEYS:  01/07/23: FOTO intake:   61%     COGNITION: 01/07/2023 Overall cognitive status: WFL    SENSATION: 01/07/2023 WFL  MUSCLE LENGTH: 01/07/23:  Hamstrings: Right 85 deg; Left 80 deg   POSTURE:  01/07/2023 rounded shoulders and forward head  PALPATION: 01/07/23: TTP: Left greater trochanter and surrounding musculature  LOWER EXTREMITY ROM:   ROM Right 01/07/23 Left 01/07/23  Hip flexion 105 102  Hip extension 10 in prone 10 in prone  Hip abduction 35 30  Hip adduction    Hip internal rotation    Hip external rotation    Knee flexion    Knee extension    Ankle dorsiflexion    Ankle plantarflexion    Ankle inversion    Ankle eversion     (Blank rows = not tested)  LOWER EXTREMITY MMT:  MMT Right 01/07/23 Left 01/07/23  Hip flexion 5 4  Hip extension    Hip abduction 5 4  Hip adduction 5 4  Hip internal rotation    Hip external rotation    Knee flexion 5 5  Knee extension 5 5  Ankle dorsiflexion    Ankle plantarflexion    Ankle inversion    Ankle eversion     (Blank rows = not tested)  LOWER EXTREMITY SPECIAL TESTS:  01/07/23 FABER test: negative on left Hip scouring test: negative on left   GAIT: 01/07/2023 Distance walked: 30 feet, level surface Assistive device utilized: None Level of assistance: Complete Independence Comments: WFL                                                                                                                                                                          TODAY'S TREATMENT                                                                          DATE: 01/28/2023 Manual Compression to Lt glute min and piriformis,skilled palpation and monitoring of soft tissue during DN Trigger Point Dry-Needling  Treatment instructions: Expect mild to moderate muscle soreness. S/S of pneumothorax if dry needled over a lung field, and to seek immediate medical attention should they occur. Patient verbalized understanding of these instructions and education.  Patient Consent Given: Yes Education handout provided: Previously provided Muscles treated: Left TFL, Left proximal hamstring, left glue med Treatment response/outcome: concordant local symptoms with twitch response Therex: NuStep L6 x 10 min Side-lying hip circles x 10 circles each way on Lt Side-lying clamshells with L4 band on Lt 2 x10 Side-lying reverse clam shells  2 x 10  IT band stretch on left x 3 holding 20 sec using stretch strap Piriformis stretch: pushing away x 3 holding 30 sec on left LE Piriformis stretch pulling to opposite shoulder x 2 hodling 30 sec on left LE.      TODAY'S TREATMENT                                                                          DATE: 01/15/2023 Manual Compression to Lt glute min and piriformis,skilled palpation  and monitoring of soft tissue during DN  Trigger Point Dry-Needling  Treatment instructions: Expect mild to moderate muscle soreness. S/S of pneumothorax if dry needled over a lung field, and to seek immediate medical attention should they occur. Patient verbalized understanding of these instructions and education.  Patient Consent Given: Yes Education handout provided: Previously provided Muscles treated: Lt glute min and piriformis Treatment response/outcome: concordant local symptoms with twitch response  Therex: NuStep L6 x 10  min Sidelying hip circles x 10 circles each way on Lt Sidelying clamshells with L4 band on Lt 2x10 Hook lying single limb clamshell 2x10 bil; L4 band Supine figure 4 pull towards stretch Lt leg 30 sec x 2 Supine figure 4 push away stretch Lt leg 30 sec x 2 Standing hip hike x 10 reps on Lt with 5 sec hold    TODAY'S TREATMENT                                                                          DATE: 01/09/2023 Manual Compression to Lt glute max, proximal hamstring  Trigger Point Dry-Needling  Treatment instructions: Expect mild to moderate muscle soreness. S/S of pneumothorax if dry needled over a lung field, and to seek immediate medical attention should they occur. Patient verbalized understanding of these instructions and education.  Patient Consent Given: Yes Education handout provided: Previously provided Muscles treated: Lt glute max, proximal hamstring near attachment Treatment response/outcome: concordant local symptoms with twitch response  Therex: Supine figure 4 push away stretch Lt leg 30 sec x 3 Supine figure 4 pull towards stretch Lt leg 30 sec x 3 Supine bridge 3 sec hold x 15 Verbal review of existing HEP Nustep Lvl 6 10 mins UE/LE      PATIENT EDUCATION:  Education details: HEP, POC Person educated: Patient Education method: Programmer, multimedia, Demonstration, Verbal cues, and Handouts Education comprehension: verbalized understanding, returned demonstration, and verbal cues required  HOME EXERCISE PROGRAM: Access Code: [email or web address] URL: [email or web address] Date: 01/15/2023 Prepared by: Moshe Cipro  Exercises - Supine Bridge  - 2 x daily - 7 x weekly - 2 sets - 10 reps - 5 seconds hold - Clamshell with Resistance  - 2 x daily - 7 x weekly - 2 sets - 10 reps - Sidelying Reverse Clamshell  - 2 x daily - 7 x weekly - 2 sets - 10 reps - Supine Piriformis Stretch with Foot on Ground  - 2 x daily - 7 x weekly - 3 reps - 20 seconds hold -  Supine Figure 4 Piriformis Stretch  - 2 x daily - 7 x weekly - 3 reps - 20 seconds hold - Sidelying Hip Circles  - 2 x daily - 7 x weekly - 2 sets - 10 circles each way - Hooklying Single Leg Bent Knee Fallouts with Resistance  - 2 x daily - 7 x weekly - 2 sets - 10 reps - Standing Hip Hiking  - 2 x daily - 7 x weekly - 1 sets - 10 reps - 5-10 sec hold  Patient Education - TENS Unit  ASSESSMENT:  CLINICAL IMPRESSION: Pt stating she has struggled this past week when she missed therapy due to vacation. Pt reporting decreased pain today following therapeutic  exercises and DN. Continue skilled PT interventions to maximize pt's function.   OBJECTIVE IMPAIRMENTS: decreased mobility, difficulty walking, decreased ROM, decreased strength, and pain.   ACTIVITY LIMITATIONS: bending, sitting, standing, squatting, sleeping, and stairs  PARTICIPATION LIMITATIONS: community activity and occupation  PERSONAL FACTORS: 1-2 comorbidities: see above pertinent history  are also affecting patient's functional outcome.   REHAB POTENTIAL: Good  CLINICAL DECISION MAKING: Stable/uncomplicated  EVALUATION COMPLEXITY: Low   GOALS: Goals reviewed with patient? Yes  SHORT TERM GOALS: (target date for Short term goals are 3 weeks 01/31/2023)   1.  Patient will demonstrate independent use of home exercise program to maintain progress from in clinic treatments. Goal status: on going 01/09/2023  LONG TERM GOALS: (target dates for all long term goals are 10 weeks  03/21/2023 )   1. Patient will demonstrate/report pain at worst less than or equal to 2/10 to facilitate minimal limitation in daily activity secondary to pain symptoms. Goal status: New   2. Patient will demonstrate independent use of home exercise program to facilitate ability to maintain/progress functional gains from skilled physical therapy services. Goal status: New   3. Patient will demonstrate FOTO outcome > or = 75 % to indicate reduced  disability due to condition. Goal status: New   4.  Patient will demonstrate left LE MMT 5/5 throughout to faciltiate usual transfers, stairs, squatting at [facility] for daily life.  Goal status: New   5.  Patient will be able to navigate up and down 1 flight of stairs with no rail with reciprocal gait pattern.  Goal status: New   6.  Pt will be able to report sitting >/= 30 minutes without pain.  Goal status: New      PLAN:  PT FREQUENCY: 1-2x/week  PT DURATION: 10 weeks  PLANNED INTERVENTIONS: Therapeutic exercises, Therapeutic activity, Neuro Muscular re-education, Balance training, Gait training, Patient/Family education, Joint mobilization, Stair training, DME instructions, Dry Needling, Electrical stimulation, Traction, Cryotherapy, vasopneumatic deviceMoist heat, Taping, Ultrasound, Ionotophoresis 4mg /ml Dexamethasone, and aquatic therapy, Manual therapy.  All included unless contraindicated  PLAN FOR NEXT SESSION: assess response to DN, continue hip strengthening and stretching  Narda Amber, PT, MPT 01/28/23 3:18 PM   01/28/23 3:18 PM

## 2023-02-03 ENCOUNTER — Ambulatory Visit (INDEPENDENT_AMBULATORY_CARE_PROVIDER_SITE_OTHER): Payer: BC Managed Care – PPO | Admitting: Physical Therapy

## 2023-02-03 ENCOUNTER — Encounter: Payer: Self-pay | Admitting: Physical Therapy

## 2023-02-03 DIAGNOSIS — M25552 Pain in left hip: Secondary | ICD-10-CM | POA: Diagnosis not present

## 2023-02-03 DIAGNOSIS — M6281 Muscle weakness (generalized): Secondary | ICD-10-CM

## 2023-02-03 DIAGNOSIS — R262 Difficulty in walking, not elsewhere classified: Secondary | ICD-10-CM | POA: Diagnosis not present

## 2023-02-03 NOTE — Therapy (Signed)
OUTPATIENT PHYSICAL THERAPY TREATMENT   Patient Name: Robin Barajas MRN: [id number] DOB:10-19-1975, 47 y.o., female Today's Date: 02/03/2023  END OF SESSION:  PT End of Session - 02/03/23 1432     Visit Number 5    Number of Visits 20    Date for PT Re-Evaluation 03/21/23    PT Start Time 1432    PT Stop Time 1510    PT Time Calculation (min) 38 min    Activity Tolerance Patient tolerated treatment well    Behavior During Therapy [facility] for tasks assessed/performed                Past Medical History:  Diagnosis Date   Allergy    Generalized headaches    Heart murmur    Hypertension 2003   Past Surgical History:  Procedure Laterality Date   APPENDECTOMY  1990   ELBOW FRACTURE SURGERY  1984   WISDOM TOOTH EXTRACTION     Patient Active Problem List   Diagnosis Date Noted   Pain of left hip 12/04/2022   Generalized headaches 09/17/2017   Vitamin D insufficiency 02/24/2017   History of diagnosis of MVP (mitral valve prolapse) 02/03/2017   Overweight (BMI 25.0-29.9) 02/03/2017   personal history of depression 02/03/2017   History of appendectomy 02/03/2017   Inactivity 02/03/2017   Primary hypertension 02/03/2017    PCP: Melida Quitter, PA  REFERRING PROVIDER: Tarry Kos, MD   REFERRING DIAG: [phone] (ICD-10-CM) - Pain of left hip  Rationale for Evaluation and Treatment: Rehabilitation  THERAPY DIAG:  Pain in left hip  Muscle weakness (generalized)  Difficulty in walking, not elsewhere classified  ONSET DATE: 6 months ago   SUBJECTIVE STATEMENT: Pt reporting over the holiday weekend that she did a little more sitting than normal. Pt reporting good response to DN but stated it was short lived.   PERTINENT HISTORY: Allergies, HA, heart murmur, HTN   PAIN:  NPRS scale: 7/10 Pain location: Lt hip Pain description: achy, throbbing Aggravating factors: sitting Relieving factors: walking  PRECAUTIONS: None  WEIGHT BEARING RESTRICTIONS:  No  FALLS:  Has patient fallen in last 6 months? 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 There is no evidence of arthropathy or other focal bone abnormality.  PATIENT SURVEYS:  01/07/23: FOTO intake:   61%     COGNITION: 01/07/2023 Overall cognitive status: WFL    SENSATION: 01/07/2023 WFL  MUSCLE LENGTH: 01/07/23:  Hamstrings: Right 85 deg; Left 80 deg   POSTURE:  01/07/2023 rounded shoulders and forward head  PALPATION: 01/07/23: TTP: Left greater trochanter and surrounding musculature  LOWER EXTREMITY ROM:   ROM Right 01/07/23 Left 01/07/23 Rt / Left 02/03/23  Hip flexion 105 102 A: 110 / A: 115 P; 130 / P: 124  Hip extension 10 in prone 10 in prone   Hip abduction 35 30   Hip adduction     Hip internal rotation     Hip external rotation     Knee flexion     Knee extension     Ankle dorsiflexion     Ankle plantarflexion  Ankle inversion     Ankle eversion      (Blank rows = not tested)  LOWER EXTREMITY MMT:  MMT Right 01/07/23 Left 01/07/23  Hip flexion 5 4  Hip extension    Hip abduction 5 4  Hip adduction 5 4  Hip internal rotation    Hip external rotation    Knee flexion 5 5  Knee extension 5 5  Ankle dorsiflexion    Ankle plantarflexion    Ankle inversion    Ankle eversion     (Blank rows = not tested)  LOWER EXTREMITY SPECIAL TESTS:  01/07/23 FABER test: negative on left Hip scouring test: negative on left   GAIT: 01/07/2023 Distance walked: 30 feet, level surface Assistive device utilized: None Level of  assistance: Complete Independence Comments: WFL                                                                                                                                                                        TODAY'S TREATMENT                                                                          DATE: 02/03/2023 Manual Compression to Lt glute min and piriformis,skilled palpation and monitoring of soft tissue during DN Trigger Point Dry-Needling  Treatment instructions: Expect mild to moderate muscle soreness. S/S of pneumothorax if dry needled over a lung field, and to seek immediate medical attention should they occur. Patient verbalized understanding of these instructions and education.  Patient Consent Given: Yes Education handout provided: Previously provided Muscles treated: Left TFL, Left proximal hamstring, left glue med Treatment response/outcome: concordant local symptoms with twitch response Therex: NuStep L6 x 6 min Mini squats c UE support x 10  Standing hip extension 2 x 10 each LE, instructions to prevent forward flexion Leaning forward with hands on the wall until glute stretch is felt and perform ball squeezes for IR hip rotation x 10 holding 5 sec.    TODAY'S TREATMENT                                                                          DATE: 01/28/2023 Manual Compression to Lt  glute min and piriformis,skilled palpation and monitoring of soft tissue during DN Trigger Point Dry-Needling  Treatment instructions: Expect mild to moderate muscle soreness. S/S of pneumothorax if dry needled over a lung field, and to seek immediate medical attention should they occur. Patient verbalized understanding of these instructions and education.  Patient Consent Given: Yes Education handout provided: Previously provided Muscles treated: Left TFL, Left proximal hamstring, left glue med Treatment response/outcome: concordant local symptoms with twitch response Therex: NuStep  L6 x 10 min Side-lying hip circles x 10 circles each way on Lt Side-lying clamshells with L4 band on Lt 2 x10 Side-lying reverse clam shells  2 x 10  IT band stretch on left x 3 holding 20 sec using stretch strap Piriformis stretch: pushing away x 3 holding 30 sec on left LE Piriformis stretch pulling to opposite shoulder x 2 hodling 30 sec on left LE.      TODAY'S TREATMENT                                                                          DATE: 01/15/2023 Manual Compression to Lt glute min and piriformis,skilled palpation and monitoring of soft tissue during DN  Trigger Point Dry-Needling  Treatment instructions: Expect mild to moderate muscle soreness. S/S of pneumothorax if dry needled over a lung field, and to seek immediate medical attention should they occur. Patient verbalized understanding of these instructions and education.  Patient Consent Given: Yes Education handout provided: Previously provided Muscles treated: Lt glute min and piriformis Treatment response/outcome: concordant local symptoms with twitch response  Therex: NuStep L6 x 10 min Sidelying hip circles x 10 circles each way on Lt Sidelying clamshells with L4 band on Lt 2x10 Hook lying single limb clamshell 2x10 bil; L4 band Supine figure 4 pull towards stretch Lt leg 30 sec x 2 Supine figure 4 push away stretch Lt leg 30 sec x 2 Standing hip hike x 10 reps on Lt with 5 sec hold    TODAY'S TREATMENT                                                                          DATE: 01/09/2023 Manual Compression to Lt glute max, proximal hamstring  Trigger Point Dry-Needling  Treatment instructions: Expect mild to moderate muscle soreness. S/S of pneumothorax if dry needled over a lung field, and to seek immediate medical attention should they occur. Patient verbalized understanding of these instructions and education.  Patient Consent Given: Yes Education handout provided: Previously provided Muscles  treated: Lt glute max, proximal hamstring near attachment Treatment response/outcome: concordant local symptoms with twitch response  Therex: Supine figure 4 push away stretch Lt leg 30 sec x 3 Supine figure 4 pull towards stretch Lt leg 30 sec x 3 Supine bridge 3 sec hold x 15 Verbal review of existing HEP Nustep Lvl 6 10 mins UE/LE      PATIENT EDUCATION:  Education details: HEP, POC Person educated:  Patient Education method: Explanation, Demonstration, Verbal cues, and Handouts Education comprehension: verbalized understanding, returned demonstration, and verbal cues required  HOME EXERCISE PROGRAM: Access Code: [email or web address] URL: [email or web address] Date: 01/15/2023 Prepared by: Moshe Cipro  Exercises - Supine Bridge  - 2 x daily - 7 x weekly - 2 sets - 10 reps - 5 seconds hold - Clamshell with Resistance  - 2 x daily - 7 x weekly - 2 sets - 10 reps - Sidelying Reverse Clamshell  - 2 x daily - 7 x weekly - 2 sets - 10 reps - Supine Piriformis Stretch with Foot on Ground  - 2 x daily - 7 x weekly - 3 reps - 20 seconds hold - Supine Figure 4 Piriformis Stretch  - 2 x daily - 7 x weekly - 3 reps - 20 seconds hold - Sidelying Hip Circles  - 2 x daily - 7 x weekly - 2 sets - 10 circles each way - Hooklying Single Leg Bent Knee Fallouts with Resistance  - 2 x daily - 7 x weekly - 2 sets - 10 reps - Standing Hip Hiking  - 2 x daily - 7 x weekly - 1 sets - 10 reps - 5-10 sec hold  Patient Education - TENS Unit  ASSESSMENT:  CLINICAL IMPRESSION: Pt arriving today again reporting 7/10 pain after sitting more this weekend than normal. Pt stating she has only been abe to perform her HEP once daily. Pt stating she experienced relief following DN at her last visit and wishing to try it again. Pt with good response today to DN and manual therapy. Continue skilled PT.   OBJECTIVE IMPAIRMENTS: decreased mobility, difficulty walking, decreased ROM, decreased strength,  and pain.   ACTIVITY LIMITATIONS: bending, sitting, standing, squatting, sleeping, and stairs  PARTICIPATION LIMITATIONS: community activity and occupation  PERSONAL FACTORS: 1-2 comorbidities: see above pertinent history  are also affecting patient's functional outcome.   REHAB POTENTIAL: Good  CLINICAL DECISION MAKING: Stable/uncomplicated  EVALUATION COMPLEXITY: Low   GOALS: Goals reviewed with patient? Yes  SHORT TERM GOALS: (target date for Short term goals are 3 weeks 01/31/2023)   1.  Patient will demonstrate independent use of home exercise program to maintain progress from in clinic treatments. Goal status: on going 01/09/2023  LONG TERM GOALS: (target dates for all long term goals are 10 weeks  03/21/2023 )   1. Patient will demonstrate/report pain at worst less than or equal to 2/10 to facilitate minimal limitation in daily activity secondary to pain symptoms. Goal status: New   2. Patient will demonstrate independent use of home exercise program to facilitate ability to maintain/progress functional gains from skilled physical therapy services. Goal status: New   3. Patient will demonstrate FOTO outcome > or = 75 % to indicate reduced disability due to condition. Goal status: New   4.  Patient will demonstrate left LE MMT 5/5 throughout to faciltiate usual transfers, stairs, squatting at [facility] for daily life.  Goal status: New   5.  Patient will be able to navigate up and down 1 flight of stairs with no rail with reciprocal gait pattern.  Goal status: New   6.  Pt will be able to report sitting >/= 30 minutes without pain.  Goal status: New      PLAN:  PT FREQUENCY: 1-2x/week  PT DURATION: 10 weeks  PLANNED INTERVENTIONS: Therapeutic exercises, Therapeutic activity, Neuro Muscular re-education, Balance training, Gait training, Patient/Family education, Joint mobilization, Stair training, DME instructions, Dry Needling, Electrical stimulation,  Traction,  Cryotherapy, vasopneumatic deviceMoist heat, Taping, Ultrasound, Ionotophoresis 4mg /ml Dexamethasone, and aquatic therapy, Manual therapy.  All included unless contraindicated  PLAN FOR NEXT SESSION: assess response to DN, continue hip strengthening and stretching  Narda Amber, PT, MPT 02/03/23 3:29 PM   02/03/23 3:29 PM

## 2023-02-05 ENCOUNTER — Ambulatory Visit (INDEPENDENT_AMBULATORY_CARE_PROVIDER_SITE_OTHER): Payer: BC Managed Care – PPO | Admitting: Physical Therapy

## 2023-02-05 ENCOUNTER — Encounter: Payer: Self-pay | Admitting: Physical Therapy

## 2023-02-05 DIAGNOSIS — M25552 Pain in left hip: Secondary | ICD-10-CM | POA: Diagnosis not present

## 2023-02-05 DIAGNOSIS — R262 Difficulty in walking, not elsewhere classified: Secondary | ICD-10-CM | POA: Diagnosis not present

## 2023-02-05 DIAGNOSIS — M6281 Muscle weakness (generalized): Secondary | ICD-10-CM | POA: Diagnosis not present

## 2023-02-05 NOTE — Therapy (Signed)
OUTPATIENT PHYSICAL THERAPY TREATMENT   Patient Name: Robin Barajas MRN: [id number] DOB:1976-03-25, 47 y.o., female Today's Date: 02/05/2023  END OF SESSION:  PT End of Session - 02/05/23 1435     Visit Number 6    Number of Visits 20    Date for PT Re-Evaluation 03/21/23    PT Start Time 1435    PT Stop Time 1505    PT Time Calculation (min) 30 min    Activity Tolerance Patient tolerated treatment well    Behavior During Therapy [facility] for tasks assessed/performed                 Past Medical History:  Diagnosis Date   Allergy    Generalized headaches    Heart murmur    Hypertension 2003   Past Surgical History:  Procedure Laterality Date   APPENDECTOMY  1990   ELBOW FRACTURE SURGERY  1984   WISDOM TOOTH EXTRACTION     Patient Active Problem List   Diagnosis Date Noted   Pain of left hip 12/04/2022   Generalized headaches 09/17/2017   Vitamin D insufficiency 02/24/2017   History of diagnosis of MVP (mitral valve prolapse) 02/03/2017   Overweight (BMI 25.0-29.9) 02/03/2017   personal history of depression 02/03/2017   History of appendectomy 02/03/2017   Inactivity 02/03/2017   Primary hypertension 02/03/2017    PCP: Melida Quitter, PA  REFERRING PROVIDER: Tarry Kos, MD   REFERRING DIAG: [phone] (ICD-10-CM) - Pain of left hip  Rationale for Evaluation and Treatment: Rehabilitation  THERAPY DIAG:  Pain in left hip  Muscle weakness (generalized)  Difficulty in walking, not elsewhere classified  ONSET DATE: 6 months ago   SUBJECTIVE STATEMENT: Doing really well; pain has been minimal.  Reports she was in pain on Monday but then eased off yesterday.    PERTINENT HISTORY: Allergies, HA, heart murmur, HTN   PAIN:  NPRS scale: 7/10 Pain location: Lt hip Pain description: achy, throbbing Aggravating factors: sitting Relieving factors: walking  PRECAUTIONS: None  WEIGHT BEARING RESTRICTIONS: No  FALLS:  Has patient fallen in last  6 months? 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 There is no evidence of arthropathy or other focal bone abnormality.  PATIENT SURVEYS:  01/07/23: FOTO intake:   61%     COGNITION: 01/07/2023 Overall cognitive status: WFL    SENSATION: 01/07/2023 WFL  MUSCLE LENGTH: 01/07/23:  Hamstrings: Right 85 deg; Left 80 deg   POSTURE:  01/07/2023 rounded shoulders and forward head  PALPATION: 01/07/23: TTP: Left greater trochanter and surrounding musculature  LOWER EXTREMITY ROM:   ROM Right 01/07/23 Left 01/07/23 Rt / Left 02/03/23  Hip flexion 105 102 A: 110 / A: 115 P; 130 / P: 124  Hip extension 10 in prone 10 in prone   Hip abduction 35 30    (Blank rows = not tested)  LOWER EXTREMITY MMT:  MMT Right 01/07/23 Left 01/07/23  Hip flexion 5 4  Hip extension    Hip abduction 5 4  Hip adduction 5 4  Hip internal rotation    Hip external  rotation    Knee flexion 5 5  Knee extension 5 5  Ankle dorsiflexion    Ankle plantarflexion    Ankle inversion    Ankle eversion     (Blank rows = not tested)  LOWER EXTREMITY SPECIAL TESTS:  01/07/23 FABER test: negative on left Hip scouring test: negative on left   GAIT: 01/07/2023 Distance walked: 30 feet, level surface Assistive device utilized: None Level of assistance: Complete Independence Comments: WFL                                                                                                                                                                        TODAY'S TREATMENT DATE:   02/05/23 Therex: Bridges 2x10 Hooklying single limb clamshell 2x10; L4 band Prone hip extension 2x10; 3 sec hold Squats with 10# KB 2x10 Deadlifts 2x10; 10# KB    02/03/2023 Manual Compression to Lt glute min and piriformis,skilled palpation and monitoring of soft tissue during DN Trigger Point Dry-Needling  Treatment instructions: Expect mild to moderate muscle soreness. S/S of pneumothorax if dry needled over a lung field, and to seek immediate medical attention should they occur. Patient verbalized understanding of these instructions and education.  Patient Consent Given: Yes Education handout provided: Previously provided Muscles treated: Left TFL, Left proximal hamstring, left glue med Treatment response/outcome: concordant local symptoms with twitch response Therex: NuStep L6 x 6 min Mini squats c UE support x 10  Standing hip extension 2 x 10 each LE, instructions to prevent forward flexion Leaning forward with hands on the wall until glute stretch is felt and perform ball squeezes for IR hip rotation x 10 holding 5 sec.    TODAY'S TREATMENT                                                                          DATE: 01/28/2023 Manual Compression to Lt glute min and piriformis,skilled palpation and monitoring of soft tissue during DN Trigger Point Dry-Needling  Treatment instructions: Expect mild to moderate muscle soreness. S/S of pneumothorax if dry needled over a lung field, and to seek immediate medical attention should they occur. Patient verbalized understanding of these instructions and education.  Patient Consent Given: Yes Education handout provided: Previously provided Muscles treated: Left TFL, Left proximal hamstring, left glue med Treatment response/outcome: concordant local symptoms with twitch response Therex: NuStep L6 x 10 min Side-lying hip circles x 10 circles each way on Lt Side-lying clamshells with L4  band on Lt 2 x10 Side-lying reverse clam shells   2 x 10  IT band stretch on left x 3 holding 20 sec using stretch strap Piriformis stretch: pushing away x 3 holding 30 sec on left LE Piriformis stretch pulling to opposite shoulder x 2 hodling 30 sec on left LE.      TODAY'S TREATMENT                                                                          DATE: 01/15/2023 Manual Compression to Lt glute min and piriformis,skilled palpation and monitoring of soft tissue during DN  Trigger Point Dry-Needling  Treatment instructions: Expect mild to moderate muscle soreness. S/S of pneumothorax if dry needled over a lung field, and to seek immediate medical attention should they occur. Patient verbalized understanding of these instructions and education.  Patient Consent Given: Yes Education handout provided: Previously provided Muscles treated: Lt glute min and piriformis Treatment response/outcome: concordant local symptoms with twitch response  Therex: NuStep L6 x 10 min Sidelying hip circles x 10 circles each way on Lt Sidelying clamshells with L4 band on Lt 2x10 Hook lying single limb clamshell 2x10 bil; L4 band Supine figure 4 pull towards stretch Lt leg 30 sec x 2 Supine figure 4 push away stretch Lt leg 30 sec x 2 Standing hip hike x 10 reps on Lt with 5 sec hold    PATIENT EDUCATION:  Education details: HEP, POC Person educated: Patient Education method: Programmer, multimedia, Demonstration, Verbal cues, and Handouts Education comprehension: verbalized understanding, returned demonstration, and verbal cues required  HOME EXERCISE PROGRAM: Access Code: [email or web address] URL: [email or web address] Date: 01/15/2023 Prepared by: Moshe Cipro  Exercises - Supine Bridge  - 2 x daily - 7 x weekly - 2 sets - 10 reps - 5 seconds hold - Clamshell with Resistance  - 2 x daily - 7 x weekly - 2 sets - 10 reps - Sidelying Reverse Clamshell  - 2 x daily - 7 x weekly - 2 sets - 10 reps - Supine Piriformis Stretch with Foot on  Ground  - 2 x daily - 7 x weekly - 3 reps - 20 seconds hold - Supine Figure 4 Piriformis Stretch  - 2 x daily - 7 x weekly - 3 reps - 20 seconds hold - Sidelying Hip Circles  - 2 x daily - 7 x weekly - 2 sets - 10 circles each way - Hooklying Single Leg Bent Knee Fallouts with Resistance  - 2 x daily - 7 x weekly - 2 sets - 10 reps - Standing Hip Hiking  - 2 x daily - 7 x weekly - 1 sets - 10 reps - 5-10 sec hold  Patient Education - TENS Unit  ASSESSMENT:  CLINICAL IMPRESSION: Overall pain minimal today so deferred DN and focused on strengthening exercises.  End of session reporting no pain.  Will continue to benefit from PT to maximize function.  OBJECTIVE IMPAIRMENTS: decreased mobility, difficulty walking, decreased ROM, decreased strength, and pain.   ACTIVITY LIMITATIONS: bending, sitting, standing, squatting, sleeping, and stairs  PARTICIPATION LIMITATIONS: community activity and occupation  PERSONAL FACTORS: 1-2 comorbidities: see above pertinent history  are also affecting patient's functional outcome.   REHAB  POTENTIAL: Good  CLINICAL DECISION MAKING: Stable/uncomplicated  EVALUATION COMPLEXITY: Low   GOALS: Goals reviewed with patient? Yes  SHORT TERM GOALS: (target date for Short term goals are 3 weeks 01/31/2023)   1.  Patient will demonstrate independent use of home exercise program to maintain progress from in clinic treatments. Goal status: on going 01/09/2023  LONG TERM GOALS: (target dates for all long term goals are 10 weeks  03/21/2023 )   1. Patient will demonstrate/report pain at worst less than or equal to 2/10 to facilitate minimal limitation in daily activity secondary to pain symptoms. Goal status: New   2. Patient will demonstrate independent use of home exercise program to facilitate ability to maintain/progress functional gains from skilled physical therapy services. Goal status: New   3. Patient will demonstrate FOTO outcome > or = 75 % to  indicate reduced disability due to condition. Goal status: New   4.  Patient will demonstrate left LE MMT 5/5 throughout to faciltiate usual transfers, stairs, squatting at [facility] for daily life.  Goal status: New   5.  Patient will be able to navigate up and down 1 flight of stairs with no rail with reciprocal gait pattern.  Goal status: New   6.  Pt will be able to report sitting >/= 30 minutes without pain.  Goal status: New      PLAN:  PT FREQUENCY: 1-2x/week  PT DURATION: 10 weeks  PLANNED INTERVENTIONS: Therapeutic exercises, Therapeutic activity, Neuro Muscular re-education, Balance training, Gait training, Patient/Family education, Joint mobilization, Stair training, DME instructions, Dry Needling, Electrical stimulation, Traction, Cryotherapy, vasopneumatic deviceMoist heat, Taping, Ultrasound, Ionotophoresis 4mg /ml Dexamethasone, and aquatic therapy, Manual therapy.  All included unless contraindicated  PLAN FOR NEXT SESSION: DN PRN, continue hip strengthening and stretching  Clarita Crane, PT, DPT 02/05/23 3:06 PM

## 2023-02-11 ENCOUNTER — Ambulatory Visit (INDEPENDENT_AMBULATORY_CARE_PROVIDER_SITE_OTHER): Payer: BC Managed Care – PPO | Admitting: Physical Therapy

## 2023-02-11 ENCOUNTER — Encounter: Payer: Self-pay | Admitting: Physical Therapy

## 2023-02-11 DIAGNOSIS — M25552 Pain in left hip: Secondary | ICD-10-CM | POA: Diagnosis not present

## 2023-02-11 DIAGNOSIS — M6281 Muscle weakness (generalized): Secondary | ICD-10-CM

## 2023-02-11 DIAGNOSIS — R262 Difficulty in walking, not elsewhere classified: Secondary | ICD-10-CM | POA: Diagnosis not present

## 2023-02-11 NOTE — Therapy (Addendum)
OUTPATIENT PHYSICAL THERAPY TREATMENT  /DISCHARGE   Patient Name: Robin Barajas MRN: [id number] DOB:1976/05/31, 47 y.o., female Today's Date: 02/11/2023  END OF SESSION:  PT End of Session - 02/11/23 1437     Visit Number 7    Number of Visits 20    Date for PT Re-Evaluation 03/21/23    PT Start Time 1435    PT Stop Time 1513    PT Time Calculation (min) 38 min    Activity Tolerance Patient tolerated treatment well    Behavior During Therapy [facility] for tasks assessed/performed                  Past Medical History:  Diagnosis Date   Allergy    Generalized headaches    Heart murmur    Hypertension 2003   Past Surgical History:  Procedure Laterality Date   APPENDECTOMY  1990   ELBOW FRACTURE SURGERY  1984   WISDOM TOOTH EXTRACTION     Patient Active Problem List   Diagnosis Date Noted   Pain of left hip 12/04/2022   Generalized headaches 09/17/2017   Vitamin D insufficiency 02/24/2017   History of diagnosis of MVP (mitral valve prolapse) 02/03/2017   Overweight (BMI 25.0-29.9) 02/03/2017   personal history of depression 02/03/2017   History of appendectomy 02/03/2017   Inactivity 02/03/2017   Primary hypertension 02/03/2017    PCP: Melida Quitter, PA  REFERRING PROVIDER: Tarry Kos, MD   REFERRING DIAG: [phone] (ICD-10-CM) - Pain of left hip  Rationale for Evaluation and Treatment: Rehabilitation  THERAPY DIAG:  Pain in left hip  Muscle weakness (generalized)  Difficulty in walking, not elsewhere classified  ONSET DATE: 6 months ago   SUBJECTIVE STATEMENT: Pt reporting no pain today upon arrival and minimal pain yesterday.   PERTINENT HISTORY: Allergies, HA, heart murmur, HTN   PAIN:  NPRS scale: none today Pain location: Lt hip Pain description: achy, throbbing Aggravating factors: sitting Relieving factors: walking  PRECAUTIONS: None  WEIGHT BEARING RESTRICTIONS: No  FALLS:  Has patient fallen in last 6 months?  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 There is no evidence of arthropathy or other focal bone abnormality.  PATIENT SURVEYS:  01/07/23: FOTO intake:   61%    02/11/23: FOTO 81%  COGNITION: 01/07/2023 Overall cognitive status: WFL    SENSATION: 01/07/2023 WFL  MUSCLE LENGTH: 01/07/23:  Hamstrings: Right 85 deg; Left 80 deg   POSTURE:  01/07/2023 rounded shoulders and forward head  PALPATION: 01/07/23: TTP: Left greater trochanter and surrounding musculature  LOWER EXTREMITY ROM:   ROM Right 01/07/23 Left 01/07/23 Rt / Left 02/03/23  Hip flexion 105 102 A: 110 / A: 115 P; 130 / P: 124  Hip extension 10 in prone 10 in prone   Hip abduction 35 30    (Blank rows = not tested)  LOWER EXTREMITY MMT:  MMT Right 01/07/23 Left 01/07/23  Hip flexion 5 4  Hip extension    Hip abduction 5 4  Hip adduction 5 4  Hip internal rotation    Hip external rotation  Knee flexion 5 5  Knee extension 5 5  Ankle dorsiflexion    Ankle plantarflexion    Ankle inversion    Ankle eversion     (Blank rows = not tested)  LOWER EXTREMITY SPECIAL TESTS:  01/07/23 FABER test: negative on left Hip scouring test: negative on left   GAIT: 01/07/2023 Distance walked: 30 feet, level surface Assistive device utilized: None Level of assistance: Complete Independence Comments: WFL                                                                                                                                                                       TODAY'S  TREATMENT DATE:  02/11/23 Therex: Nustep: level 5 x 6 minutes UE/LE Standing trunk extension: x 10 holding 5 sec elbow on wall Standing hip extension: x 15 bil c UE support Sidestepping c red TB around knees 20 feet x 6  Forward lunge c red TB around knees 20 feet x 4 Sidelying left hip circles x 20 each directions Sidelying hip abd: toe down  2 x 10  Child's pose: x 2 holding 20 sec Prone: lifting opposite arm/leg x 10 holding 3 sec    TODAY'S TREATMENT DATE:  02/05/23 Therex: Bridges 2x10 Hooklying single limb clamshell 2x10; L4 band Prone hip extension 2x10; 3 sec hold Squats with 10# KB 2x10 Deadlifts 2x10; 10# KB    02/03/2023 Manual Compression to Lt glute min and piriformis,skilled palpation and monitoring of soft tissue during DN Trigger Point Dry-Needling  Treatment instructions: Expect mild to moderate muscle soreness. S/S of pneumothorax if dry needled over a lung field, and to seek immediate medical attention should they occur. Patient verbalized understanding of these instructions and education.  Patient Consent Given: Yes Education handout provided: Previously provided Muscles treated: Left TFL, Left proximal hamstring, left glue med Treatment response/outcome: concordant local symptoms with twitch response Therex: NuStep L6 x 6 min Mini squats c UE support x 10  Standing hip extension 2 x 10 each LE, instructions to prevent forward flexion Leaning forward with hands on the wall until glute stretch is felt and perform ball squeezes for IR hip rotation x 10 holding 5 sec.    TODAY'S TREATMENT                                                                          DATE: 01/28/2023 Manual Compression to Lt glute min and piriformis,skilled palpation and monitoring of soft tissue during DN  Trigger Point Dry-Needling  Treatment instructions: Expect mild to moderate muscle soreness. S/S of pneumothorax if dry needled over a lung field, and to seek immediate medical  attention should they occur. Patient verbalized understanding of these instructions and education.  Patient Consent Given: Yes Education handout provided: Previously provided Muscles treated: Left TFL, Left proximal hamstring, left glue med Treatment response/outcome: concordant local symptoms with twitch response Therex: NuStep L6 x 10 min Side-lying hip circles x 10 circles each way on Lt Side-lying clamshells with L4 band on Lt 2 x10 Side-lying reverse clam shells  2 x 10  IT band stretch on left x 3 holding 20 sec using stretch strap Piriformis stretch: pushing away x 3 holding 30 sec on left LE Piriformis stretch pulling to opposite shoulder x 2 hodling 30 sec on left LE.         PATIENT EDUCATION:  Education details: HEP, POC Person educated: Patient Education method: Programmer, multimedia, Demonstration, Verbal cues, and Handouts Education comprehension: verbalized understanding, returned demonstration, and verbal cues required  HOME EXERCISE PROGRAM: Access Code: [email or web address] URL: [email or web address] Date: 01/15/2023 Prepared by: Moshe Cipro  Exercises - Supine Bridge  - 2 x daily - 7 x weekly - 2 sets - 10 reps - 5 seconds hold - Clamshell with Resistance  - 2 x daily - 7 x weekly - 2 sets - 10 reps - Sidelying Reverse Clamshell  - 2 x daily - 7 x weekly - 2 sets - 10 reps - Supine Piriformis Stretch with Foot on Ground  - 2 x daily - 7 x weekly - 3 reps - 20 seconds hold - Supine Figure 4 Piriformis Stretch  - 2 x daily - 7 x weekly - 3 reps - 20 seconds hold - Sidelying Hip Circles  - 2 x daily - 7 x weekly - 2 sets - 10 circles each way - Hooklying Single Leg Bent Knee Fallouts with Resistance  - 2 x daily - 7 x weekly - 2 sets - 10 reps - Standing Hip Hiking  - 2 x daily - 7 x weekly - 1 sets - 10 reps - 5-10 sec hold  Patient Education - TENS Unit  ASSESSMENT:  CLINICAL IMPRESSION: Pt reporting no pain this visit. Pt has improved her FOTO score  to 81%. We agreed to decrease pt's frequency to 1x/ week for the next two weeks and prepare for discharge from skilled PT services  OBJECTIVE IMPAIRMENTS: decreased mobility, difficulty walking, decreased ROM, decreased strength, and pain.   ACTIVITY LIMITATIONS: bending, sitting, standing, squatting, sleeping, and stairs  PARTICIPATION LIMITATIONS: community activity and occupation  PERSONAL FACTORS: 1-2 comorbidities: see above pertinent history  are also affecting patient's functional outcome.   REHAB POTENTIAL: Good  CLINICAL DECISION MAKING: Stable/uncomplicated  EVALUATION COMPLEXITY: Low   GOALS: Goals reviewed with patient? Yes  SHORT TERM GOALS: (target date for Short term goals are 3 weeks 01/31/2023)   1.  Patient will demonstrate independent use of home exercise program to maintain progress from in clinic treatments. Goal status: on going 01/09/2023  LONG TERM GOALS: (target dates for all long term goals are 10 weeks  03/21/2023 )   1. Patient will demonstrate/report pain at worst less than or equal to 2/10 to facilitate minimal limitation in daily activity secondary to pain symptoms. Goal status: New   2. Patient will demonstrate independent use of home exercise program to facilitate ability to maintain/progress functional gains from skilled physical therapy services. Goal status: New   3.  Patient will demonstrate FOTO outcome > or = 75 % to indicate reduced disability due to condition. Goal status: New   4.  Patient will demonstrate left LE MMT 5/5 throughout to faciltiate usual transfers, stairs, squatting at [facility] for daily life.  Goal status: New   5.  Patient will be able to navigate up and down 1 flight of stairs with no rail with reciprocal gait pattern.  Goal status: New   6.  Pt will be able to report sitting >/= 30 minutes without pain.  Goal status: New      PLAN:  PT FREQUENCY: 1-2x/week  PT DURATION: 10 weeks  PLANNED INTERVENTIONS:  Therapeutic exercises, Therapeutic activity, Neuro Muscular re-education, Balance training, Gait training, Patient/Family education, Joint mobilization, Stair training, DME instructions, Dry Needling, Electrical stimulation, Traction, Cryotherapy, vasopneumatic deviceMoist heat, Taping, Ultrasound, Ionotophoresis 4mg /ml Dexamethasone, and aquatic therapy, Manual therapy.  All included unless contraindicated  PLAN FOR NEXT SESSION: DN PRN, continue hip strengthening and stretching Assess goals, update HEP as needed  Narda Amber, PT, MPT 02/11/23 3:08 PM   PHYSICAL THERAPY DISCHARGE SUMMARY  Visits from Start of Care: 7  Current functional level related to goals / functional outcomes: See note   Remaining deficits: See note   Education / Equipment: HEP  Patient goals were partially met. Patient is being discharged due to not returning since the last visit.  Chyrel Masson, PT, DPT, OCS, ATC 03/17/23  11:47 AM

## 2023-02-13 ENCOUNTER — Encounter: Payer: BC Managed Care – PPO | Admitting: Rehabilitative and Restorative Service Providers"

## 2023-02-18 ENCOUNTER — Encounter: Payer: BC Managed Care – PPO | Admitting: Physical Therapy

## 2023-02-20 ENCOUNTER — Encounter: Payer: BC Managed Care – PPO | Admitting: Physical Therapy

## 2023-02-25 ENCOUNTER — Encounter: Payer: BC Managed Care – PPO | Admitting: Physical Therapy

## 2023-03-15 ENCOUNTER — Other Ambulatory Visit: Payer: Self-pay | Admitting: Family Medicine

## 2023-03-15 DIAGNOSIS — I10 Essential (primary) hypertension: Secondary | ICD-10-CM

## 2023-03-30 ENCOUNTER — Encounter: Payer: Self-pay | Admitting: Family Medicine

## 2023-03-30 DIAGNOSIS — I10 Essential (primary) hypertension: Secondary | ICD-10-CM

## 2023-04-01 ENCOUNTER — Other Ambulatory Visit: Payer: Self-pay | Admitting: Family Medicine

## 2023-04-01 DIAGNOSIS — I10 Essential (primary) hypertension: Secondary | ICD-10-CM

## 2023-04-01 MED ORDER — LOSARTAN POTASSIUM 25 MG PO TABS
50.0000 mg | ORAL_TABLET | Freq: Every day | ORAL | 0 refills | Status: DC
Start: 2023-04-01 — End: 2023-04-07

## 2023-04-07 MED ORDER — LOSARTAN POTASSIUM 25 MG PO TABS
50.0000 mg | ORAL_TABLET | Freq: Every day | ORAL | 0 refills | Status: DC
Start: 2023-04-07 — End: 2023-04-07

## 2023-04-07 MED ORDER — LOSARTAN POTASSIUM 25 MG PO TABS
50.0000 mg | ORAL_TABLET | Freq: Every day | ORAL | 1 refills | Status: DC
Start: 2023-04-07 — End: 2023-05-13

## 2023-04-07 NOTE — Addendum Note (Signed)
Addended by: Tonny Bollman on: 04/07/2023 01:24 PM   Modules accepted: Orders

## 2023-04-07 NOTE — Telephone Encounter (Signed)
Pt only received a 15 day supply of the medication. Pt would like to know the reason behind the limited supply or enough to get her to her appt.

## 2023-04-07 NOTE — Addendum Note (Signed)
Addended by: Tonny Bollman on: 04/07/2023 01:11 PM   Modules accepted: Orders

## 2023-04-07 NOTE — Telephone Encounter (Signed)
60 tablets sent to pharmacy

## 2023-05-11 ENCOUNTER — Encounter: Payer: Self-pay | Admitting: Family Medicine

## 2023-05-11 DIAGNOSIS — Z8041 Family history of malignant neoplasm of ovary: Secondary | ICD-10-CM

## 2023-05-12 ENCOUNTER — Telehealth: Payer: Self-pay | Admitting: Genetic Counselor

## 2023-05-12 NOTE — Telephone Encounter (Signed)
Inbasket message sent to the [facility] scheduling team to get the patient scheduled.

## 2023-05-13 ENCOUNTER — Other Ambulatory Visit: Payer: Self-pay | Admitting: Family Medicine

## 2023-05-13 DIAGNOSIS — I10 Essential (primary) hypertension: Secondary | ICD-10-CM

## 2023-05-16 ENCOUNTER — Telehealth: Payer: Self-pay | Admitting: Genetic Counselor

## 2023-05-16 NOTE — Telephone Encounter (Signed)
Left patient a message regarding scheduled appointment times/dates for Genetics Counselor; left callback if needed for rescheduling

## 2023-05-23 ENCOUNTER — Other Ambulatory Visit: Payer: Self-pay | Admitting: Family Medicine

## 2023-05-23 DIAGNOSIS — E559 Vitamin D deficiency, unspecified: Secondary | ICD-10-CM

## 2023-05-23 DIAGNOSIS — Z0000 Encounter for general adult medical examination without abnormal findings: Secondary | ICD-10-CM

## 2023-05-23 DIAGNOSIS — I10 Essential (primary) hypertension: Secondary | ICD-10-CM

## 2023-05-30 ENCOUNTER — Other Ambulatory Visit: Payer: BC Managed Care – PPO

## 2023-05-30 DIAGNOSIS — Z0000 Encounter for general adult medical examination without abnormal findings: Secondary | ICD-10-CM | POA: Diagnosis not present

## 2023-05-30 DIAGNOSIS — I10 Essential (primary) hypertension: Secondary | ICD-10-CM

## 2023-05-30 DIAGNOSIS — E559 Vitamin D deficiency, unspecified: Secondary | ICD-10-CM

## 2023-05-31 LAB — LIPID PANEL
Chol/HDL Ratio: 2.3 ratio (ref 0.0–4.4)
Cholesterol, Total: 174 mg/dL (ref 100–199)
HDL: 77 mg/dL (ref >39)
LDL Chol Calc (NIH): 85 mg/dL (ref 0–99)
Triglycerides: 60 mg/dL (ref 0–149)
VLDL Cholesterol Cal: 12 mg/dL (ref 5–40)

## 2023-05-31 LAB — COMPREHENSIVE METABOLIC PANEL
ALT: 13 [IU]/L (ref 0–32)
AST: 16 [IU]/L (ref 0–40)
Albumin: 4.5 g/dL (ref 3.9–4.9)
Alkaline Phosphatase: 37 [IU]/L — ABNORMAL LOW (ref 44–121)
BUN/Creatinine Ratio: 16 (ref 9–23)
BUN: 11 mg/dL (ref 6–24)
Bilirubin Total: 0.6 mg/dL (ref 0.0–1.2)
CO2: 21 mmol/L (ref 20–29)
Calcium: 9.6 mg/dL (ref 8.7–10.2)
Chloride: 99 mmol/L (ref 96–106)
Creatinine, Ser: 0.67 mg/dL (ref 0.57–1.00)
Globulin, Total: 2.4 g/dL (ref 1.5–4.5)
Glucose: 81 mg/dL (ref 70–99)
Potassium: 4.5 mmol/L (ref 3.5–5.2)
Sodium: 134 mmol/L (ref 134–144)
Total Protein: 6.9 g/dL (ref 6.0–8.5)
eGFR: 108 mL/min/{1.73_m2} (ref >59)

## 2023-05-31 LAB — HEMOGLOBIN A1C
Est. average glucose Bld gHb Est-mCnc: 114 mg/dL
Hgb A1c MFr Bld: 5.6 % (ref 4.8–5.6)

## 2023-05-31 LAB — VITAMIN D 25 HYDROXY (VIT D DEFICIENCY, FRACTURES): Vit D, 25-Hydroxy: 36.6 ng/mL (ref 30.0–100.0)

## 2023-05-31 LAB — TSH: TSH: 1.29 u[IU]/mL (ref 0.450–4.500)

## 2023-06-06 ENCOUNTER — Encounter: Payer: Self-pay | Admitting: Family Medicine

## 2023-06-06 ENCOUNTER — Ambulatory Visit (INDEPENDENT_AMBULATORY_CARE_PROVIDER_SITE_OTHER): Payer: BC Managed Care – PPO | Admitting: Family Medicine

## 2023-06-06 VITALS — BP 126/79 | HR 86 | Resp 18 | Ht 65.0 in | Wt 183.0 lb

## 2023-06-06 DIAGNOSIS — Z1159 Encounter for screening for other viral diseases: Secondary | ICD-10-CM

## 2023-06-06 DIAGNOSIS — Z8659 Personal history of other mental and behavioral disorders: Secondary | ICD-10-CM | POA: Diagnosis not present

## 2023-06-06 DIAGNOSIS — I10 Essential (primary) hypertension: Secondary | ICD-10-CM | POA: Diagnosis not present

## 2023-06-06 DIAGNOSIS — E6609 Other obesity due to excess calories: Secondary | ICD-10-CM

## 2023-06-06 DIAGNOSIS — Z0000 Encounter for general adult medical examination without abnormal findings: Secondary | ICD-10-CM

## 2023-06-06 DIAGNOSIS — E66811 Obesity, class 1: Secondary | ICD-10-CM | POA: Diagnosis not present

## 2023-06-06 DIAGNOSIS — Z6830 Body mass index (BMI) 30.0-30.9, adult: Secondary | ICD-10-CM

## 2023-06-06 NOTE — Assessment & Plan Note (Addendum)
PHQ-9 score 7, PHQ 2 score 0.  Will continue to monitor.

## 2023-06-06 NOTE — Assessment & Plan Note (Signed)
BP goal <140/90.  Stable, at goal.  Continue losartan 50 mg daily.  CBC and CMP within normal limits with the exception of alkaline phosphatase which is low but stable.  Continue ambulatory blood pressure monitoring, low-sodium diet, routine physical activity.  Will continue to monitor.

## 2023-06-06 NOTE — Progress Notes (Signed)
Complete physical exam  Patient: Robin Barajas   DOB: 1976-07-10   47 y.o. Female  MRN: [id number]  Subjective:    Chief Complaint  Patient presents with   Annual Exam    Robin RYER is a 47 y.o. female who presents today for a complete physical exam. She reports consuming a  general balanced  diet and tries to prioritize vegetables.  She walks every single day.  She generally feels well. She reports sleeping poorly in general. She does have additional problems to discuss today.    Most recent fall risk assessment:    12/04/2022    8:30 AM  Fall Risk   Falls in the past year? 1  Number falls in past yr: 1  Injury with Fall? 0  Risk for fall due to : History of fall(s)  Follow up Falls evaluation completed     Most recent depression and anxiety screenings:    06/06/2023   10:06 AM 12/04/2022    8:29 AM  PHQ 2/9 Scores  PHQ - 2 Score 0 0  PHQ- 9 Score 7 9      06/06/2023   10:06 AM 12/04/2022    8:29 AM 05/29/2022    9:00 AM 02/14/2022    2:13 PM  GAD 7 : Generalized Anxiety Score  Nervous, Anxious, on Edge 0 1 0 0  Control/stop worrying 0 0 0 0  Worry too much - different things 0 0 1 0  Trouble relaxing 0 1 1 0  Restless 0 1 0 0  Easily annoyed or irritable 1 2 1 1   Afraid - awful might happen 0 0 0 0  Total GAD 7 Score 1 5 3 1   Anxiety Difficulty Not difficult at all Somewhat difficult Not difficult at all Not difficult at all    Patient Active Problem List   Diagnosis Date Noted   Pain of left hip 12/04/2022   Generalized headaches 09/17/2017   Vitamin D insufficiency 02/24/2017   History of diagnosis of MVP (mitral valve prolapse) 02/03/2017   Overweight (BMI 25.0-29.9) 02/03/2017   personal history of depression 02/03/2017   Primary hypertension 02/03/2017    Past Surgical History:  Procedure Laterality Date   APPENDECTOMY  1990   ELBOW FRACTURE SURGERY  1984   WISDOM TOOTH EXTRACTION     Social History   Tobacco Use   Smoking status: Never     Passive exposure: Never   Smokeless tobacco: Never  Vaping Use   Vaping status: Never Used  Substance Use Topics   Alcohol use: No   Drug use: No   Family History  Problem Relation Age of Onset   Depression Mother    Colon polyps Father    Depression Sister    Alcohol abuse Maternal Grandfather    Cancer Paternal Grandmother        ovarian   Diabetes Paternal Grandfather    Colon cancer Neg Hx    Esophageal cancer Neg Hx    Rectal cancer Neg Hx    Stomach cancer Neg Hx    Allergies  Allergen Reactions   Morphine And Codeine Shortness Of Breath and Nausea And Vomiting   Codeine Itching   Turmeric Hives, Itching and Rash     Patient Care Team: Melida Quitter, PA as PCP - General (Family Medicine) Candice Camp, MD as Consulting Physician (Obstetrics and Gynecology)   Outpatient Medications Prior to Visit  Medication Sig   acetaminophen (TYLENOL) 500 MG tablet Take  500 mg by mouth every 6 (six) hours as needed for mild pain or headache.   ibuprofen (ADVIL) 200 MG tablet Take 200 mg by mouth every 6 (six) hours as needed for fever, headache or mild pain.   losartan (COZAAR) 25 MG tablet TAKE 2 TABLETS BY MOUTH EVERY DAY   Magnesium 200 MG TABS Take 1 tablet by mouth at bedtime.   naproxen (NAPROSYN) 500 MG tablet Take 1 tablet (500 mg total) by mouth 2 (two) times daily with a meal.   tiZANidine (ZANAFLEX) 4 MG tablet Take 1 tablet (4 mg total) by mouth every 6 (six) hours as needed for muscle spasms.   [DISCONTINUED] predniSONE (STERAPRED UNI-PAK 21 TAB) 10 MG (21) TBPK tablet Take as directed   Facility-Administered Medications Prior to Visit  Medication Dose Route Frequency Provider   0.9 %  sodium chloride infusion  500 mL Intravenous Once Jenel Lucks, MD    Review of Systems  Constitutional:  Negative for chills, fever and malaise/fatigue.  HENT:  Negative for congestion and hearing loss.   Eyes:  Negative for blurred vision and double vision.   Respiratory:  Negative for cough and shortness of breath.   Cardiovascular:  Negative for chest pain, palpitations and leg swelling.  Gastrointestinal:  Negative for abdominal pain, constipation, diarrhea and heartburn.  Genitourinary:  Positive for frequency (Wears pads). Negative for urgency.  Musculoskeletal:  Negative for myalgias and neck pain.  Neurological:  Negative for headaches.  Endo/Heme/Allergies:  Negative for polydipsia.  Psychiatric/Behavioral:  Negative for depression. The patient has insomnia. The patient is not nervous/anxious.       Objective:    BP 126/79 (BP Location: Left Arm, Patient Position: Sitting, Cuff Size: Normal)   Pulse 86   Resp 18   Ht 5\' 5"  (1.651 m)   Wt 183 lb (83 kg)   LMP 06/05/2023 (Exact Date)   SpO2 98%   BMI 30.45 kg/m    Physical Exam Constitutional:      General: She is not in acute distress.    Appearance: Normal appearance.  HENT:     Head: Normocephalic and atraumatic.     Right Ear: Tympanic membrane, ear canal and external ear normal.     Left Ear: Tympanic membrane, ear canal and external ear normal.     Nose: Nose normal.     Mouth/Throat:     Mouth: Mucous membranes are moist.     Pharynx: No oropharyngeal exudate or posterior oropharyngeal erythema.  Eyes:     Extraocular Movements: Extraocular movements intact.     Conjunctiva/sclera: Conjunctivae normal.     Pupils: Pupils are equal, round, and reactive to light.     Comments: Wearing contacts  Neck:     Thyroid: No thyroid mass, thyromegaly or thyroid tenderness.  Cardiovascular:     Rate and Rhythm: Normal rate and regular rhythm.     Heart sounds: Normal heart sounds. No murmur heard.    No friction rub. No gallop.  Pulmonary:     Effort: Pulmonary effort is normal. No respiratory distress.     Breath sounds: Normal breath sounds. No wheezing, rhonchi or rales.  Abdominal:     General: Abdomen is flat. Bowel sounds are normal. There is no distension.      Palpations: There is no mass.     Tenderness: There is no abdominal tenderness. There is no guarding.  Musculoskeletal:        General: Normal range of motion.  Cervical back: Normal range of motion and neck supple.  Lymphadenopathy:     Cervical: No cervical adenopathy.  Skin:    General: Skin is warm and dry.  Neurological:     Mental Status: She is alert and oriented to person, place, and time.     Cranial Nerves: No cranial nerve deficit.     Motor: No weakness.     Deep Tendon Reflexes: Reflexes normal.  Psychiatric:        Mood and Affect: Mood normal.       Assessment & Plan:    Routine Health Maintenance and Physical Exam  Immunization History  Administered Date(s) Administered   Influenza,inj,Quad PF,6+ Mos 04/11/2021, 05/29/2022   PFIZER(Purple Top)SARS-COV-2 Vaccination 03/16/2020, 04/06/2020   PPD Test 02/12/2022   Tdap 03/12/2018    Health Maintenance  Topic Date Due   HIV Screening  Never done   COVID-19 Vaccine (3 - 2023-24 season) 06/22/2023 (Originally 03/30/2023)   INFLUENZA VACCINE  10/27/2023 (Originally 02/27/2023)   Cervical Cancer Screening (HPV/Pap Cotest)  01/08/2024   DTaP/Tdap/Td (2 - Td or Tdap) 03/12/2028   Colonoscopy  04/17/2031   Hepatitis C Screening  Completed   HPV VACCINES  Aged Out    Reviewed most recent labs including CBC, CMP, lipid panel, A1C, TSH, and vitamin D. All within normal limits/stable from last check other than alkaline phosphatase low but stable.  Discussed health benefits of physical activity, and encouraged her to engage in regular exercise appropriate for her age and condition.  Wellness examination  Primary hypertension Assessment & Plan: BP goal <140/90.  Stable, at goal.  Continue losartan 50 mg daily.  CBC and CMP within normal limits with the exception of alkaline phosphatase which is low but stable.  Continue ambulatory blood pressure monitoring, low-sodium diet, routine physical activity.  Will continue to  monitor.   personal history of depression Assessment & Plan: PHQ-9 score 7, PHQ 2 score 0.  Will continue to monitor.     Return in about 6 months (around 12/04/2023) for follow-up for HTN, in person or video.     Melida Quitter, PA

## 2023-06-10 ENCOUNTER — Inpatient Hospital Stay: Payer: BC Managed Care – PPO

## 2023-06-10 ENCOUNTER — Inpatient Hospital Stay: Payer: BC Managed Care – PPO | Attending: Genetic Counselor | Admitting: Genetic Counselor

## 2023-06-10 ENCOUNTER — Other Ambulatory Visit: Payer: Self-pay | Admitting: Genetic Counselor

## 2023-06-10 DIAGNOSIS — Z8041 Family history of malignant neoplasm of ovary: Secondary | ICD-10-CM

## 2023-06-10 DIAGNOSIS — Z1379 Encounter for other screening for genetic and chromosomal anomalies: Secondary | ICD-10-CM

## 2023-06-10 LAB — GENETIC SCREENING ORDER

## 2023-06-11 ENCOUNTER — Encounter: Payer: Self-pay | Admitting: Genetic Counselor

## 2023-06-11 DIAGNOSIS — Z8041 Family history of malignant neoplasm of ovary: Secondary | ICD-10-CM | POA: Insufficient documentation

## 2023-06-11 NOTE — Progress Notes (Unsigned)
REFERRING PROVIDER: Melida Quitter, PA [address] [place],  [place] [address]  PRIMARY PROVIDER:  Melida Quitter, PA  PRIMARY REASON FOR VISIT:  1. Family history of ovarian cancer    HISTORY OF PRESENT ILLNESS:   Robin Barajas, a 47 y.o. female, was seen for a [place] cancer genetics consultation at the request of Dr. Jairo Ben due to a family history of cancer.  Robin Barajas presents to clinic today to discuss the possibility of a hereditary predisposition to cancer, to discuss genetic testing, and to further clarify her future cancer risks, as well as potential cancer risks for family members.   Robin Barajas is a 47 y.o. female with no personal history of cancer.    RISK FACTORS:  Menarche was at age 41.  First live birth at age 33.  OCP use for approximately 5-6 years.  Ovaries intact: no.  Uterus intact: no.  Menopausal status: premenopausal.  HRT use: 0 years. Colonoscopy: yes;  normal 2022 colonoscopy . Mammogram within the last year: most recent was 2022. Number of breast biopsies: 0. Up to date with pelvic exams: yes. Any excessive radiation exposure in the past: no  Past Medical History:  Diagnosis Date   Allergy    Generalized headaches    Heart murmur    Hypertension 2003    Past Surgical History:  Procedure Laterality Date   APPENDECTOMY  1990   ELBOW FRACTURE SURGERY  1984   WISDOM TOOTH EXTRACTION      Social History   Socioeconomic History   Marital status: Married    Spouse name: Not on file   Number of children: Not on file   Years of education: Not on file   Highest education level: Not on file  Occupational History   Not on file  Tobacco Use   Smoking status: Never    Passive exposure: Never   Smokeless tobacco: Never  Vaping Use   Vaping status: Never Used  Substance and Sexual Activity   Alcohol use: No   Drug use: No   Sexual activity: Yes  Other Topics Concern   Not on file  Social History Narrative   Not on file    Social Determinants of Health   Financial Resource Strain: Not on file  Food Insecurity: Not on file  Transportation Needs: Not on file  Physical Activity: Not on file  Stress: Not on file  Social Connections: Not on file     FAMILY HISTORY:  We obtained a detailed, 4-generation family history.  Her paternal grandmother was diagnosed with ovarian cancer in her 50s, she died at age 50. Robin Barajas is unaware of previous family history of genetic testing for hereditary cancer risks. There is no reported Ashkenazi Jewish ancestry.       GENETIC COUNSELING ASSESSMENT: Robin Barajas is a 47 y.o. female with a family history of cancer which is somewhat suggestive of a hereditary predisposition to cancer. We, therefore, discussed and recommended the following at today's visit.   DISCUSSION: We discussed that 5 - 10% of cancer is hereditary, with most cases of hereditary ovarian cancer associated with BRCA1/2.  There are other genes that can be associated with hereditary ovarian cancer syndromes.  We discussed that testing is beneficial for several reasons, including knowing about other cancer risks, identifying potential screening and risk-reduction options that may be appropriate, and to understanding if other family members could be at risk for cancer and allowing them to undergo genetic testing.  We reviewed the characteristics, features and inheritance patterns of hereditary cancer syndromes. We also discussed genetic testing, including the appropriate family members to test, the process of testing, insurance coverage and turn-around-time for results. We discussed the implications of a negative, positive, carrier and/or variant of uncertain significant result. We discussed that negative results would be uninformative given that Robin Barajas does not have a personal history of cancer. We recommended Robin Barajas pursue genetic testing for a panel that contains genes associated with ovarian.  Robin Barajas was  offered a common hereditary cancer panel (48 genes) and an expanded pan-cancer panel (71 genes). Robin Barajas was informed of the benefits and limitations of each panel, including that expanded pan-cancer panels contain several genes that do not have clear management guidelines at this point in time.  We also discussed that as the number of genes included on a panel increases, the chances of variants of uncertain significance increases.  After considering the benefits and limitations of each gene panel, Robin Barajas elected to have [facility] Common Cancer Panel.  The Common Hereditary Cancers Panel offered by [facility] includes sequencing and/or deletion duplication testing of the following 48 genes: APC, ATM, AXIN2, BAP1, BARD1, BMPR1A, BRCA1, BRCA2, BRIP1, CDH1, CDK4, CDKN2A (p14ARF and p16INK4a only), CHEK2, CTNNA1, DICER1, EPCAM (Deletion/duplication testing only), FH, GREM1 (promoter region duplication testing only), HOXB13, KIT, MBD4, MEN1, MLH1, MSH2, MSH3, MSH6, MUTYH, NF1, NHTL1, PALB2, PDGFRA, PMS2, POLD1, POLE, PTEN, RAD51C, RAD51D, SDHA (sequencing analysis only except exon 14), SDHB, SDHC, SDHD, SMAD4, SMARCA4. STK11, TP53, TSC1, TSC2, and VHL.   Based on Robin Barajas's family history of cancer, she meets medical criteria for genetic testing. Despite that she meets criteria, she may still have an out of pocket cost. We discussed that if her out of pocket cost for testing is over $100, the laboratory should contact them to discuss self-pay prices, patient pay assistance programs, if applicable, and other billing options.  We discussed that some people do not want to undergo genetic testing due to fear of genetic discrimination.  A federal law called the Genetic Information Non-Discrimination Act (GINA) of 2008 helps protect individuals against genetic discrimination based on their genetic test results.  It impacts both health insurance and employment.  With health insurance, it protects against increased premiums,  being kicked off insurance or being forced to take a test in order to be insured.  For employment it protects against hiring, firing and promoting decisions based on genetic test results.  GINA does not apply to those in the [employer], those who work for companies with less than 15 employees, and new life insurance or long-term disability insurance policies.  Health status due to a cancer diagnosis is not protected under GINA.  Additionally, Robin Barajas expressed interest in genetic testing for her mother's reported mutation in a gene associated with sitosterolemia. We reviewed her mother's genetic testing report and her mother has an "increased risk allele" in the ABCG8 gene (c.55G>C). After reviewing her mother's report in more detail, we learned her mother does NOT have sitosterolemia and she does not even have heterozygous sitosterolemia. The "increased risk allele" just means she has an increased risk for gallstones. Her mother's specific variant has NOT been associated with sitosterolemia. The variant is a gain-of-function mutation and causes reduced serum phytosterol levels (sitosterolemia is caused by a loss-of-function mutation). Therefore, her mother does not even have heterozygous sitosterolemia. This is a very common variant and present in 9% of the population. We reviewed the option to  test for the increased risk allele, but that it would just tell us if she is at an increased risk for gallstones. Robin Barajas declined testing for her mother's ABCG8 increased risk allele.   PLAN: After considering the risks, benefits, and limitations, Robin Barajas provided informed consent to pursue genetic testing and the blood sample was sent to [facility] for analysis of the Common Cancer Panel. Results should be available within approximately 2-3 weeks' time, at which point they will be disclosed by telephone to Robin Barajas, as will any additional recommendations warranted by these results. Robin Barajas will receive a  summary of her genetic counseling visit and a copy of her results once available. This information will also be available in Epic.   Robin Barajas questions were answered to her satisfaction today. Our contact information was provided should additional questions or concerns arise. Thank you for the referral and allowing [place] to share in the care of your patient.   Lalla Brothers, MS, [facility] Genetic Counselor [email or web address] (P) [phone]  The patient was seen for a total of 40 minutes in face-to-face genetic counseling.  The patient was seen alone.  Drs. Pamelia Hoit and/or Mosetta Putt were available to discuss this case as needed.  _______________________________________________________________________ For Office Staff:  Number of people involved in session: 1 Was an Intern/ student involved with case: yes, Asher Muir

## 2023-06-25 ENCOUNTER — Telehealth: Payer: Self-pay | Admitting: Genetic Counselor

## 2023-06-25 ENCOUNTER — Encounter: Payer: Self-pay | Admitting: Genetic Counselor

## 2023-06-25 DIAGNOSIS — Z6831 Body mass index (BMI) 31.0-31.9, adult: Secondary | ICD-10-CM | POA: Diagnosis not present

## 2023-06-25 DIAGNOSIS — Z1379 Encounter for other screening for genetic and chromosomal anomalies: Secondary | ICD-10-CM | POA: Insufficient documentation

## 2023-06-25 DIAGNOSIS — Z01419 Encounter for gynecological examination (general) (routine) without abnormal findings: Secondary | ICD-10-CM | POA: Diagnosis not present

## 2023-06-25 DIAGNOSIS — Z1231 Encounter for screening mammogram for malignant neoplasm of breast: Secondary | ICD-10-CM | POA: Diagnosis not present

## 2023-06-25 NOTE — Telephone Encounter (Addendum)
I contacted Ms. Haefner to discuss her genetic testing results. No pathogenic variants were identified in the 48 genes analyzed. Detailed clinic note to follow.  The test report has been scanned into EPIC and is located under the Molecular Pathology section of the Results Review tab.  A portion of the result report is included below for reference.   Lalla Brothers, MS, [facility] Genetic Counselor [email or web address] (P) [phone]

## 2023-06-27 ENCOUNTER — Encounter: Payer: Self-pay | Admitting: Genetic Counselor

## 2023-06-27 ENCOUNTER — Ambulatory Visit: Payer: Self-pay | Admitting: Genetic Counselor

## 2023-06-27 DIAGNOSIS — Z1379 Encounter for other screening for genetic and chromosomal anomalies: Secondary | ICD-10-CM

## 2023-06-27 NOTE — Progress Notes (Signed)
HPI:   Ms. Kubin was previously seen in the [facility] due to a family history of cancer and concerns regarding a hereditary predisposition to cancer. Please refer to our prior cancer genetics clinic note for more information regarding our discussion, assessment and recommendations, at the time. Ms. Dutra recent genetic test results were disclosed to her, as were recommendations warranted by these results. These results and recommendations are discussed in more detail below.  CANCER HISTORY:  Oncology History   No history exists.    FAMILY HISTORY:  We obtained a detailed, 4-generation family history.  Her paternal grandmother was diagnosed with ovarian cancer in her 52s, she died at age 21. Ms. Guitreau is unaware of previous family history of genetic testing for hereditary cancer risks. There is no reported Ashkenazi Jewish ancestry.           GENETIC TEST RESULTS:  The [facility] Common Cancer Panel found no pathogenic mutations.   The Common Hereditary Cancers Panel offered by [facility] includes sequencing and/or deletion duplication testing of the following 48 genes: APC, ATM, AXIN2, BAP1, BARD1, BMPR1A, BRCA1, BRCA2, BRIP1, CDH1, CDK4, CDKN2A (p14ARF and p16INK4a only), CHEK2, CTNNA1, DICER1, EPCAM (Deletion/duplication testing only), FH, GREM1 (promoter region duplication testing only), HOXB13, KIT, MBD4, MEN1, MLH1, MSH2, MSH3, MSH6, MUTYH, NF1, NHTL1, PALB2, PDGFRA, PMS2, POLD1, POLE, PTEN, RAD51C, RAD51D, SDHA (sequencing analysis only except exon 14), SDHB, SDHC, SDHD, SMAD4, SMARCA4. STK11, TP53, TSC1, TSC2, and VHL.  The test report has been scanned into EPIC and is located under the Molecular Pathology section of the Results Review tab.  A portion of the result report is included below for reference. Genetic testing reported out on 06/22/2023.      Even though a pathogenic variant was not identified, possible explanations for the cancer in the family may  include: There may be no hereditary risk for cancer in the family. The cancers in her family may be due to other genetic or environmental factors. There may be a gene mutation in one of these genes that current testing methods cannot detect, but that chance is small. There could be another gene that has not yet been discovered, or that we have not yet tested, that is responsible for the cancer diagnoses in the family.  It is also possible there is a hereditary cause for the cancer in the family that Ms. Cline did not inherit.  Therefore, it is important to remain in touch with cancer genetics in the future so that we can continue to offer Ms. Jacober the most up to date genetic testing.   ADDITIONAL GENETIC TESTING:  We discussed with Ms. Stratton that her genetic testing was fairly extensive.  If there are genes identified to increase cancer risk that can be analyzed in the future, we would be happy to discuss and coordinate this testing at that time.    CANCER SCREENING RECOMMENDATIONS:  Ms. Midyette test result is considered negative (normal).  This means that we have not identified a hereditary cause for her family history of cancer at this time.   An individual's cancer risk and medical management are not determined by genetic test results alone. Overall cancer risk assessment incorporates additional factors, including personal medical history, family history, and any available genetic information that may result in a personalized plan for cancer prevention and surveillance. Therefore, it is recommended she continue to follow the cancer management and screening guidelines provided by her primary healthcare provider.  RECOMMENDATIONS FOR FAMILY MEMBERS:  Since she did not inherit a mutation in a cancer predisposition gene included on this panel, her children could not have inherited a mutation from her in one of these genes. Other members of the family may still carry a pathogenic variant in one of  these genes that Ms. Tolan did not inherit. Based on the family history, we recommend her father and paternal aunts/uncles have genetic counseling and testing.   FOLLOW-UP:  Cancer genetics is a rapidly advancing field and it is possible that new genetic tests will be appropriate for her and/or her family members in the future. We encouraged her to remain in contact with cancer genetics on an annual basis so we can update her personal and family histories and let her know of advances in cancer genetics that may benefit this family.   Our contact number was provided. Ms. Mckercher questions were answered to her satisfaction, and she knows she is welcome to call us at anytime with additional questions or concerns.   Lalla Brothers, MS, [facility] Genetic Counselor [email or web address] (P) [phone]

## 2023-09-04 ENCOUNTER — Encounter: Payer: Self-pay | Admitting: Family Medicine

## 2023-11-14 ENCOUNTER — Other Ambulatory Visit: Payer: Self-pay | Admitting: Family Medicine

## 2023-11-14 DIAGNOSIS — I10 Essential (primary) hypertension: Secondary | ICD-10-CM

## 2023-11-18 NOTE — Telephone Encounter (Signed)
 LVM to return call to schedule appt around 12/04/23 according to [facility] last note.

## 2023-12-04 ENCOUNTER — Ambulatory Visit: Payer: BC Managed Care – PPO | Admitting: Family Medicine

## 2024-01-12 ENCOUNTER — Telehealth (INDEPENDENT_AMBULATORY_CARE_PROVIDER_SITE_OTHER): Admitting: Family Medicine

## 2024-01-12 ENCOUNTER — Encounter: Payer: Self-pay | Admitting: Family Medicine

## 2024-01-12 DIAGNOSIS — I10 Essential (primary) hypertension: Secondary | ICD-10-CM | POA: Diagnosis not present

## 2024-01-12 MED ORDER — LOSARTAN POTASSIUM 50 MG PO TABS: 50.0000 mg | ORAL_TABLET | Freq: Every day | ORAL | Status: DC

## 2024-01-12 NOTE — Assessment & Plan Note (Signed)
 Well controlled on 50mg  losartan  daily.  Continue current dosing.  F/u in 6 month w/ physical.

## 2024-01-12 NOTE — Progress Notes (Signed)
   Established Patient Office Visit  Subjective   Patient ID: Robin Barajas, female    DOB: Nov 30, 1975  Age: 48 y.o. MRN: [id number]  No chief complaint on file.   HPI  Patient location: work Provider location: [facility] Method of visit: video Duration: 15 min  Pt here for 6 month f/u on bp.  No concerns. Home reading have been normal.  Pt taking losartan  50mg  daily.  No SE. Compliant.    The 10-year ASCVD risk score (Arnett DK, et al., 2019) is: 0.7%  Health Maintenance Due  Topic Date Due   HIV Screening  Never done   Pneumococcal Vaccine 74-68 Years old (1 of 2 - PCV) Never done   COVID-19 Vaccine (3 - 2024-25 season) 03/30/2023   Cervical Cancer Screening (HPV/Pap Cotest)  01/08/2024      Objective:     Ht 5' 5 (1.651 m)   Wt 190 lb (86.2 kg)   BMI 31.62 kg/m    Physical Exam Gen: alert, oriented Pulm: no resp distress Pscyh: pleasant   No results found for any visits on 01/12/24.      Assessment & Plan:   Primary hypertension Assessment & Plan: Well controlled on 50mg  losartan  daily.  Continue current dosing.  F/u in 6 month w/ physical.    Orders: -     Losartan  Potassium; Take 1 tablet (50 mg total) by mouth daily.     Return in about 6 months (around 07/13/2024) for physical.    Laneta Pintos, MD

## 2024-01-12 NOTE — Patient Instructions (Signed)
 Robin Barajas

## 2024-05-15 ENCOUNTER — Encounter: Payer: Self-pay | Admitting: Family Medicine

## 2024-05-20 ENCOUNTER — Ambulatory Visit (INDEPENDENT_AMBULATORY_CARE_PROVIDER_SITE_OTHER): Admitting: Family Medicine

## 2024-05-20 ENCOUNTER — Encounter: Payer: Self-pay | Admitting: Family Medicine

## 2024-05-20 VITALS — BP 136/81 | HR 98 | Ht 65.0 in | Wt 180.4 lb

## 2024-05-20 DIAGNOSIS — N951 Menopausal and female climacteric states: Secondary | ICD-10-CM | POA: Diagnosis not present

## 2024-05-20 DIAGNOSIS — Z6830 Body mass index (BMI) 30.0-30.9, adult: Secondary | ICD-10-CM

## 2024-05-20 DIAGNOSIS — I10 Essential (primary) hypertension: Secondary | ICD-10-CM | POA: Diagnosis not present

## 2024-05-20 DIAGNOSIS — E559 Vitamin D deficiency, unspecified: Secondary | ICD-10-CM | POA: Diagnosis not present

## 2024-05-20 MED ORDER — BUPROPION HCL ER (XL) 150 MG PO TB24: 150.0000 mg | ORAL_TABLET | Freq: Every day | ORAL | 2 refills | Status: DC

## 2024-05-20 NOTE — Progress Notes (Signed)
 Established Patient Office Visit  Subjective   Patient ID: Robin Barajas, female    DOB: April 09, 1976  Age: 48 y.o. MRN: [id number]  Chief Complaint  Patient presents with   peri menopause    HPI  Subjective - Reports perimenopausal symptoms. - Periods are now very light, lasting ~24 hours, stopping for 2 days, then returning for 2 days. This pattern has occurred for the last 4 months. Cycle timing is still monthly. No IUD. - Reports significant irritability daily. Feels like she is going to have a hot flash but does not actually have one. Denies night sweats. - Reports decreased interest in being around people, including family.  Medications No current medications mentioned. Previous trial of Wellbutrin (bupropion) for postpartum depression in 2006/2007 was effective. Tolerated OCPs well in the past. Had a poor experience with an IUD.  PMH, PSH, FH, Social Hx PMHx: History of postpartum depression. History of mitral valve prolapse, which she was told she outgrew. Wore a heart monitor 3 years ago for palpitations, with no findings. No history of blood clots. PSH: None mentioned. FHx: Paternal grandmother died from ovarian cancer. Genetic testing for cancer-associated genes was negative. Social Hx: Husband had a vasectomy.  ROS Constitutional: Denies hot flashes, night sweats. Psychiatric: Reports irritability, social withdrawal.   Assessment and Plan Perimenopausal Symptoms:   The 10-year ASCVD risk score (Arnett DK, et al., 2019) is: 0.8%  Health Maintenance Due  Topic Date Due   HIV Screening  Never done   Pneumococcal Vaccine (1 of 2 - PCV) Never done   Hepatitis B Vaccines 19-59 Average Risk (1 of 3 - 19+ 3-dose series) Never done   Mammogram  11/14/2021   Cervical Cancer Screening (HPV/Pap Cotest)  01/08/2024   Influenza Vaccine  02/27/2024   COVID-19 Vaccine (3 - 2025-26 season) 03/29/2024      Objective:     BP 136/81   Pulse 98   Ht 5' 5 (1.651 m)   Wt  180 lb 6.4 oz (81.8 kg)   LMP 05/05/2024   SpO2 100%   BMI 30.02 kg/m    Physical Exam Gen: alert, oriented Pulm: no respiratory distress Psych: pleasant affect   No results found for any visits on 05/20/24.      Assessment & Plan:   Perimenopausal symptoms Assessment & Plan:  Experiencing mood changes, specifically irritability, and menstrual irregularities consistent with perimenopause. History of postpartum depression with good response to bupropion. Family history of ovarian cancer but negative genetic testing. Counseled on risks/benefits of hormonal vs. non-hormonal treatment options. Risks of long-term hormone replacement therapy discussed, including increased risk of breast cancer, endometrial cancer, ovarian cancer, blood clots, and stroke. Discussed SSRIs and bupropion as non-hormonal options. Advised pt that bupropion is not as well studied for irritability associated with menopause but pt is familiar with this medication and has taken it in the past and would prefer to try this first.  - Start bupropion extended-release once daily. - F/U in 4-6 weeks for well visit and to assess medication efficacy. - Labs ordered by previous provider 11 months ago to be drawn.   Primary hypertension -     Comprehensive metabolic panel with GFR; Future -     CBC with Differential/Platelet; Future  Vitamin D  insufficiency -     VITAMIN D  25 Hydroxy (Vit-D Deficiency, Fractures); Future  BMI 30.0-30.9,adult -     Comprehensive metabolic panel with GFR; Future -     CBC with Differential/Platelet; Future -  Lipid panel; Future -     Hemoglobin A1c; Future  Other orders -     buPROPion HCl ER (XL); Take 1 tablet (150 mg total) by mouth daily.  Dispense: 30 tablet; Refill: 2     Return in about 5 weeks (around 06/24/2024) for physical.    Toribio MARLA Slain, MD

## 2024-05-20 NOTE — Patient Instructions (Signed)
 It was nice to see you today,  We addressed the following topics today:  - I have prescribed bupropion extended-release for you to take once a day. I will send information for you to read about it. - If you have any side effects or concerns after starting the medication, please send a message through MyChart. - Please schedule a follow-up visit for your physical in about a month. You can schedule this at the front desk.  Have a great day,  Rolan Slain, MD

## 2024-05-22 ENCOUNTER — Other Ambulatory Visit: Payer: Self-pay | Admitting: Family Medicine

## 2024-05-22 DIAGNOSIS — I10 Essential (primary) hypertension: Secondary | ICD-10-CM

## 2024-05-24 DIAGNOSIS — N951 Menopausal and female climacteric states: Secondary | ICD-10-CM | POA: Insufficient documentation

## 2024-05-24 NOTE — Assessment & Plan Note (Signed)
 Experiencing mood changes, specifically irritability, and menstrual irregularities consistent with perimenopause. History of postpartum depression with good response to bupropion. Family history of ovarian cancer but negative genetic testing. Counseled on risks/benefits of hormonal vs. non-hormonal treatment options. Risks of long-term hormone replacement therapy discussed, including increased risk of breast cancer, endometrial cancer, ovarian cancer, blood clots, and stroke. Discussed SSRIs and bupropion as non-hormonal options. Advised pt that bupropion is not as well studied for irritability associated with menopause but pt is familiar with this medication and has taken it in the past and would prefer to try this first.  - Start bupropion extended-release once daily. - F/U in 4-6 weeks for well visit and to assess medication efficacy. - Labs ordered by previous provider 11 months ago to be drawn.

## 2024-06-03 ENCOUNTER — Encounter: Payer: Self-pay | Admitting: Family Medicine

## 2024-06-03 DIAGNOSIS — I10 Essential (primary) hypertension: Secondary | ICD-10-CM

## 2024-06-03 MED ORDER — LOSARTAN POTASSIUM 50 MG PO TABS: 50.0000 mg | ORAL_TABLET | Freq: Every day | ORAL | 1 refills | Status: AC

## 2024-06-11 ENCOUNTER — Other Ambulatory Visit: Payer: Self-pay | Admitting: Family Medicine

## 2024-06-23 ENCOUNTER — Ambulatory Visit (INDEPENDENT_AMBULATORY_CARE_PROVIDER_SITE_OTHER): Admitting: Family Medicine

## 2024-06-23 ENCOUNTER — Other Ambulatory Visit

## 2024-06-23 DIAGNOSIS — I10 Essential (primary) hypertension: Secondary | ICD-10-CM

## 2024-06-23 DIAGNOSIS — E559 Vitamin D deficiency, unspecified: Secondary | ICD-10-CM

## 2024-06-23 DIAGNOSIS — Z6830 Body mass index (BMI) 30.0-30.9, adult: Secondary | ICD-10-CM

## 2024-06-24 LAB — COMPREHENSIVE METABOLIC PANEL WITH GFR
ALT: 14 IU/L (ref 0–32)
AST: 16 IU/L (ref 0–40)
Albumin: 4.4 g/dL (ref 3.9–4.9)
Alkaline Phosphatase: 39 IU/L — ABNORMAL LOW (ref 41–116)
BUN/Creatinine Ratio: 11 (ref 9–23)
BUN: 10 mg/dL (ref 6–24)
Bilirubin Total: 0.5 mg/dL (ref 0.0–1.2)
CO2: 21 mmol/L (ref 20–29)
Calcium: 9.6 mg/dL (ref 8.7–10.2)
Chloride: 100 mmol/L (ref 96–106)
Creatinine, Ser: 0.89 mg/dL (ref 0.57–1.00)
Globulin, Total: 2.3 g/dL (ref 1.5–4.5)
Glucose: 78 mg/dL (ref 70–99)
Potassium: 4.4 mmol/L (ref 3.5–5.2)
Sodium: 137 mmol/L (ref 134–144)
Total Protein: 6.7 g/dL (ref 6.0–8.5)
eGFR: 80 mL/min/1.73 (ref >59)

## 2024-06-24 LAB — CBC WITH DIFFERENTIAL/PLATELET
Basophils Absolute: 0 x10E3/uL (ref 0.0–0.2)
Basos: 1 %
EOS (ABSOLUTE): 0 x10E3/uL (ref 0.0–0.4)
Eos: 1 %
Hematocrit: 40 % (ref 34.0–46.6)
Hemoglobin: 13 g/dL (ref 11.1–15.9)
Immature Grans (Abs): 0 x10E3/uL (ref 0.0–0.1)
Immature Granulocytes: 0 %
Lymphocytes Absolute: 1.3 x10E3/uL (ref 0.7–3.1)
Lymphs: 30 %
MCH: 30 pg (ref 26.6–33.0)
MCHC: 32.5 g/dL (ref 31.5–35.7)
MCV: 92 fL (ref 79–97)
Monocytes Absolute: 0.3 x10E3/uL (ref 0.1–0.9)
Monocytes: 8 %
Neutrophils Absolute: 2.7 x10E3/uL (ref 1.4–7.0)
Neutrophils: 60 %
Platelets: 261 x10E3/uL (ref 150–450)
RBC: 4.33 x10E6/uL (ref 3.77–5.28)
RDW: 12.5 % (ref 11.7–15.4)
WBC: 4.4 x10E3/uL (ref 3.4–10.8)

## 2024-06-24 LAB — VITAMIN D 25 HYDROXY (VIT D DEFICIENCY, FRACTURES): Vit D, 25-Hydroxy: 33.7 ng/mL (ref 30.0–100.0)

## 2024-06-24 LAB — LIPID PANEL
Chol/HDL Ratio: 2.7 ratio (ref 0.0–4.4)
Cholesterol, Total: 196 mg/dL (ref 100–199)
HDL: 72 mg/dL (ref >39)
LDL Chol Calc (NIH): 110 mg/dL — ABNORMAL HIGH (ref 0–99)
Triglycerides: 80 mg/dL (ref 0–149)
VLDL Cholesterol Cal: 14 mg/dL (ref 5–40)

## 2024-06-24 LAB — HEMOGLOBIN A1C
Est. average glucose Bld gHb Est-mCnc: 108 mg/dL
Hgb A1c MFr Bld: 5.4 % (ref 4.8–5.6)

## 2024-06-28 ENCOUNTER — Ambulatory Visit: Payer: Self-pay | Admitting: Family Medicine

## 2024-06-29 NOTE — Progress Notes (Signed)
 Pt was scheduled for labs on a nurse visit.  When pt came in office appt was cancelled and added as a lab visit.  The labs were released under that clinical encounter.  I set this visit as completed so that it could be signed.

## 2024-06-30 ENCOUNTER — Ambulatory Visit (INDEPENDENT_AMBULATORY_CARE_PROVIDER_SITE_OTHER): Admitting: Family Medicine

## 2024-06-30 ENCOUNTER — Encounter: Payer: Self-pay | Admitting: Family Medicine

## 2024-06-30 VITALS — BP 136/84 | HR 89 | Ht 65.0 in | Wt 182.4 lb

## 2024-06-30 DIAGNOSIS — Z8659 Personal history of other mental and behavioral disorders: Secondary | ICD-10-CM | POA: Diagnosis not present

## 2024-06-30 DIAGNOSIS — Z0000 Encounter for general adult medical examination without abnormal findings: Secondary | ICD-10-CM

## 2024-06-30 DIAGNOSIS — E782 Mixed hyperlipidemia: Secondary | ICD-10-CM

## 2024-06-30 MED ORDER — DULOXETINE HCL 30 MG PO CPEP: 30.0000 mg | ORAL_CAPSULE | Freq: Every day | ORAL | 2 refills | Status: DC

## 2024-06-30 NOTE — Progress Notes (Signed)
 Annual physical  Subjective    Patient ID: Robin Barajas, female    DOB: May 27, 1976  Age: 48 y.o. MRN: [id number]  Chief Complaint  Patient presents with   Annual Exam   HPI Robin Barajas is a 48 y.o. old female here  for annual exam.   Subjective - Follow-up for annual physical. Main concern is current antidepressant therapy. Reports bupropion  (Wellbutrin ) is not effective and is causing significant insomnia. - Reports a history of effective treatment with duloxetine  (Cymbalta ) approximately 14 years ago, and had confused it with Wellbutrin , which her mother takes. - Discussed recent lab results. - Reports no new concerns.  Medications Losartan , bupropion  150 mg daily, magnesium at night for sleep and muscle cramps, and over-the-counter elderberry syrup. Reports no longer taking tizanidine  or naproxen , which were for a prior episode of hip pain. Takes Tylenol as needed.  PMH, PSH, FH, Social Hx PMHx: Hypertension. History of depression. History of severe hip pain requiring physical therapy. PSH: None mentioned. FH: Maternal grandmother with ovarian cancer. Mother takes Wellbutrin . Social Hx: Works full-time at [employer]. No tobacco use. No alcohol use.  ROS Constitutional: Denies fatigue, but reports significant insomnia from medication. Psychiatric: Reports ineffective treatment for mood on current medication. GYN: Reports regular, mild monthly menses.   The 10-year ASCVD risk score (Arnett DK, et al., 2019) is: 1.1%  Health Maintenance Due  Topic Date Due   HIV Screening  Never done   Pneumococcal Vaccine (1 of 2 - PCV) Never done   Hepatitis B Vaccines 19-59 Average Risk (1 of 3 - 19+ 3-dose series) Never done   Mammogram  11/14/2021   Cervical Cancer Screening (HPV/Pap Cotest)  01/08/2024   Influenza Vaccine  02/27/2024   COVID-19 Vaccine (3 - 2025-26 season) 03/29/2024      Objective:     BP 136/84   Pulse 89   Ht 5' 5 (1.651 m)   Wt 182 lb 6.4 oz (82.7 kg)    LMP 06/02/2024   SpO2 95%   BMI 30.35 kg/m    Physical Exam Gen: alert, oriented HEENT: perrla, eomi, mmm CV: rrr, no murmur Pulm: lctab. No wheeze or crackles.  GI: soft, nbs.  Nontender to palpation MSK: strength equal b/l. Normal gait Ext: no pedal edema Skin: warm and dry, no rashes Psych: pleasant affect.  Spontaneous speech   No results found for any visits on 06/30/24.      Assessment & Plan:   Physical exam, annual  personal history of depression Assessment & Plan: - Reports bupropion  150 mg daily is ineffective for mood and is causing significant insomnia. Has a history of a good response to duloxetine  (Cymbalta ) in the past. Plan is to switch medications. - Taper bupropion  by taking one tablet every other day for two weeks, then stop. - Start duloxetine  after stopping bupropion . Prescription sent to [facility] on [address]. - If insomnia persists on duloxetine , will consider a trial of low-dose doxepin.   Moderate mixed hyperlipidemia not requiring statin therapy Assessment & Plan: - Mildly elevated LDL cholesterol on recent labs. Diet and lifestyle reviewed. Reports working full-time with less dedicated exercise and eating provided lunches at work, which may be a change from previous diet. - Counseled on dietary modifications to lower cholesterol, including reducing red meat (beef, pork, bacon, sausage) and dairy products (butter, cheese).   Healthcare maintenance Assessment & Plan: - Pap smear completed in October 2024. Will request records. - Mammogram completed in October 2024. - Up to  date on COVID and flu vaccines. - Discussed pneumococcal vaccine, recommended at age 35. - Follow-up scheduled as a video visit in 6-8 weeks to assess response to medication change.   Other orders -     DULoxetine  HCl; Take 1 capsule (30 mg total) by mouth daily.  Dispense: 30 capsule; Refill: 2     Return in about 6 weeks (around 08/11/2024) for f/u cymbalta .     Robin MARLA Slain, MD

## 2024-06-30 NOTE — Patient Instructions (Signed)
 It was nice to see you today,  We addressed the following topics today: - I would like you to start tapering off the Wellbutrin . Take one pill every other day for the next two weeks. After two weeks, you can stop taking it completely. - Once you have stopped the Wellbutrin , you can start taking the Cymbalta. I have sent the prescription to your pharmacy. - Please schedule a video visit with me in about six to eight weeks to discuss how the new medication is working for you.  Have a great day,  Rolan Slain, MD

## 2024-07-04 DIAGNOSIS — E782 Mixed hyperlipidemia: Secondary | ICD-10-CM | POA: Insufficient documentation

## 2024-07-04 DIAGNOSIS — Z0000 Encounter for general adult medical examination without abnormal findings: Secondary | ICD-10-CM | POA: Insufficient documentation

## 2024-07-04 NOTE — Assessment & Plan Note (Signed)
-   Pap smear completed in October 2024. Will request records. - Mammogram completed in October 2024. - Up to date on COVID and flu vaccines. - Discussed pneumococcal vaccine, recommended at age 48. - Follow-up scheduled as a video visit in 6-8 weeks to assess response to medication change.

## 2024-07-04 NOTE — Assessment & Plan Note (Signed)
-   Mildly elevated LDL cholesterol on recent labs. Diet and lifestyle reviewed. Reports working full-time with less dedicated exercise and eating provided lunches at work, which may be a change from previous diet. - Counseled on dietary modifications to lower cholesterol, including reducing red meat (beef, pork, bacon, sausage) and dairy products (butter, cheese).

## 2024-07-04 NOTE — Assessment & Plan Note (Signed)
-   Reports bupropion  150 mg daily is ineffective for mood and is causing significant insomnia. Has a history of a good response to duloxetine  (Cymbalta ) in the past. Plan is to switch medications. - Taper bupropion  by taking one tablet every other day for two weeks, then stop. - Start duloxetine  after stopping bupropion . Prescription [facility] to [facility] on [address]. - If insomnia persists on duloxetine , will consider a trial of low-dose doxepin.

## 2024-07-30 ENCOUNTER — Other Ambulatory Visit: Payer: Self-pay | Admitting: Family Medicine

## 2024-08-10 ENCOUNTER — Encounter: Payer: Self-pay | Admitting: Family Medicine

## 2024-08-10 ENCOUNTER — Other Ambulatory Visit: Payer: Self-pay | Admitting: Medical Genetics

## 2024-08-11 ENCOUNTER — Telehealth: Admitting: Family Medicine

## 2024-08-11 ENCOUNTER — Other Ambulatory Visit (HOSPITAL_COMMUNITY)
Admission: RE | Admit: 2024-08-11 | Discharge: 2024-08-11 | Disposition: A | Discharge status: Home / Self Care | Payer: Self-pay | Source: Ambulatory Visit | Attending: Family Medicine | Admitting: Family Medicine

## 2024-08-11 ENCOUNTER — Encounter: Payer: Self-pay | Admitting: Family Medicine

## 2024-08-11 VITALS — Ht 65.0 in | Wt 182.0 lb

## 2024-08-11 DIAGNOSIS — Z8659 Personal history of other mental and behavioral disorders: Secondary | ICD-10-CM | POA: Diagnosis not present

## 2024-08-11 DIAGNOSIS — I10 Essential (primary) hypertension: Secondary | ICD-10-CM

## 2024-08-11 NOTE — Progress Notes (Signed)
" ° °  Established Patient Office Visit  Subjective   Patient ID: Robin Barajas, female    DOB: January 25, 1976  Age: 50 y.o. MRN: [id number]  No chief complaint on file.  Patient location: home Provider location: [facility] Method: video Duration: 23 min  History of Present Illness   Robin Barajas is a 49 year old female who presents for follow-up on Cymbalta  use and blood pressure management.  She started Cymbalta  30 mg each morning 1-2 weeks ago for mood and sleep. Her sleep has improved, but her mood is unchanged, which she relates to increased work stress.  She takes losartan  for hypertension. Home blood pressure readings have been slightly higher recently, with a reading of 136/86 mmHg last night. She checks her blood pressure regularly.          The 10-year ASCVD risk score (Arnett DK, et al., 2019) is: 1.1%  Health Maintenance Due  Topic Date Due   HIV Screening  Never done   Pneumococcal Vaccine (1 of 2 - PCV) Never done   Hepatitis B Vaccines 19-59 Average Risk (1 of 3 - 19+ 3-dose series) Never done   Mammogram  11/14/2021   Cervical Cancer Screening (HPV/Pap Cotest)  01/08/2024   COVID-19 Vaccine (3 - 2025-26 season) 03/29/2024      Objective:     Ht 5' 5 (1.651 m)   Wt 182 lb (82.6 kg)   LMP 07/26/2024   BMI 30.29 kg/m    Physical Exam     Gen: alert, oriented Pulm: no respiratory distress Psych: pleasant affect       No results found for any visits on 08/11/24.      Assessment & Plan:   Primary hypertension Assessment & Plan: Blood pressure slightly elevated at 136/86 mmHg. Current management with losartan  is adequate. - Monitor blood pressure regularly. - If systolic consistently 130s-140s or diastolic above 85, send MyChart message with logs for potential medication adjustment. - Consider increasing losartan  dose or switching to a different ARB if needed.    personal history of depression Assessment & Plan: On Cymbalta  for  one week with improved sleep but no significant mood improvement. Work stressors may affect mood. - Continue Cymbalta  30 mg daily for two more weeks. - If no mood improvement, may increase Cymbalta  by taking two capsules, as directed. - Ensure refill at new dose if increased.           Return in about 6 months (around 02/08/2025) for HTN.    Toribio MARLA Slain, MD  "

## 2024-08-11 NOTE — Assessment & Plan Note (Signed)
 On Cymbalta  for one week with improved sleep but no significant mood improvement. Work stressors may affect mood. - Continue Cymbalta  30 mg daily for two more weeks. - If no mood improvement, may increase Cymbalta  by taking two capsules, as directed. - Ensure refill at new dose if increased.

## 2024-08-11 NOTE — Assessment & Plan Note (Addendum)
 Blood pressure slightly elevated at 136/86 mmHg. Current management with losartan  is adequate. - Monitor blood pressure regularly. - If systolic consistently 130s-140s or diastolic above 85, send MyChart message with logs for potential medication adjustment. - Consider increasing losartan  dose or switching to a different ARB if needed.

## 2024-08-20 LAB — GENECONNECT MOLECULAR SCREEN: Genetic Analysis Overall Interpretation: NEGATIVE

## 2024-12-27 ENCOUNTER — Ambulatory Visit: Admitting: Family Medicine
